# Patient Record
Sex: Male | Born: 1975 | Race: White | Hispanic: No | Marital: Single | Smoking: Former smoker
Health system: Southern US, Community
[De-identification: ages and names within clinical notes are randomized; demographics above are authoritative.]

## PROBLEM LIST (undated history)

## (undated) DIAGNOSIS — G4733 Obstructive sleep apnea (adult) (pediatric): Secondary | ICD-10-CM

## (undated) DIAGNOSIS — N200 Calculus of kidney: Secondary | ICD-10-CM

## (undated) DIAGNOSIS — Z9989 Dependence on other enabling machines and devices: Secondary | ICD-10-CM

## (undated) DIAGNOSIS — K648 Other hemorrhoids: Secondary | ICD-10-CM

## (undated) DIAGNOSIS — R7303 Prediabetes: Secondary | ICD-10-CM

## (undated) DIAGNOSIS — K5792 Diverticulitis of intestine, part unspecified, without perforation or abscess without bleeding: Secondary | ICD-10-CM

## (undated) DIAGNOSIS — Z87442 Personal history of urinary calculi: Secondary | ICD-10-CM

## (undated) DIAGNOSIS — M109 Gout, unspecified: Secondary | ICD-10-CM

## (undated) DIAGNOSIS — F419 Anxiety disorder, unspecified: Secondary | ICD-10-CM

## (undated) DIAGNOSIS — T7840XA Allergy, unspecified, initial encounter: Secondary | ICD-10-CM

## (undated) DIAGNOSIS — G4730 Sleep apnea, unspecified: Secondary | ICD-10-CM

## (undated) DIAGNOSIS — K5790 Diverticulosis of intestine, part unspecified, without perforation or abscess without bleeding: Secondary | ICD-10-CM

## (undated) HISTORY — DX: Sleep apnea, unspecified: G47.30

## (undated) HISTORY — DX: Obstructive sleep apnea (adult) (pediatric): G47.33

## (undated) HISTORY — DX: Diverticulosis of intestine, part unspecified, without perforation or abscess without bleeding: K57.90

## (undated) HISTORY — DX: Calculus of kidney: N20.0

## (undated) HISTORY — DX: Allergy, unspecified, initial encounter: T78.40XA

## (undated) HISTORY — DX: Gout, unspecified: M10.9

## (undated) HISTORY — DX: Dependence on other enabling machines and devices: Z99.89

## (undated) HISTORY — PX: NO PAST SURGERIES: SHX2092

## (undated) HISTORY — DX: Anxiety disorder, unspecified: F41.9

## (undated) HISTORY — DX: Other hemorrhoids: K64.8

---

## 1999-03-30 ENCOUNTER — Emergency Department (HOSPITAL_COMMUNITY): Admission: EM | Admit: 1999-03-30 | Discharge: 1999-03-30 | Payer: Self-pay | Admitting: Emergency Medicine

## 2007-11-07 ENCOUNTER — Encounter: Admission: RE | Admit: 2007-11-07 | Discharge: 2007-11-07 | Payer: Self-pay | Admitting: Internal Medicine

## 2007-11-12 ENCOUNTER — Encounter: Admission: RE | Admit: 2007-11-12 | Discharge: 2007-11-12 | Payer: Self-pay | Admitting: Internal Medicine

## 2007-11-14 ENCOUNTER — Ambulatory Visit (HOSPITAL_COMMUNITY): Admission: RE | Admit: 2007-11-14 | Discharge: 2007-11-14 | Payer: Self-pay | Admitting: Urology

## 2010-02-18 ENCOUNTER — Ambulatory Visit: Payer: Self-pay | Admitting: Internal Medicine

## 2010-02-18 DIAGNOSIS — G4733 Obstructive sleep apnea (adult) (pediatric): Secondary | ICD-10-CM | POA: Insufficient documentation

## 2010-02-22 ENCOUNTER — Telehealth: Payer: Self-pay | Admitting: Internal Medicine

## 2010-04-09 ENCOUNTER — Encounter: Payer: Self-pay | Admitting: Internal Medicine

## 2010-04-09 ENCOUNTER — Ambulatory Visit (HOSPITAL_BASED_OUTPATIENT_CLINIC_OR_DEPARTMENT_OTHER): Admission: RE | Admit: 2010-04-09 | Discharge: 2010-04-09 | Payer: Self-pay | Admitting: Internal Medicine

## 2010-04-11 ENCOUNTER — Ambulatory Visit: Payer: Self-pay | Admitting: Internal Medicine

## 2010-04-23 ENCOUNTER — Ambulatory Visit: Payer: Self-pay | Admitting: Internal Medicine

## 2010-04-27 ENCOUNTER — Encounter: Payer: Self-pay | Admitting: Internal Medicine

## 2010-05-11 ENCOUNTER — Encounter: Payer: Self-pay | Admitting: Internal Medicine

## 2010-06-01 ENCOUNTER — Encounter: Payer: Self-pay | Admitting: Internal Medicine

## 2010-06-02 ENCOUNTER — Ambulatory Visit: Payer: Self-pay | Admitting: Internal Medicine

## 2010-06-25 ENCOUNTER — Encounter: Payer: Self-pay | Admitting: Internal Medicine

## 2010-07-19 ENCOUNTER — Encounter: Payer: Self-pay | Admitting: Internal Medicine

## 2010-08-12 ENCOUNTER — Ambulatory Visit: Payer: Self-pay | Admitting: Internal Medicine

## 2010-11-11 NOTE — Assessment & Plan Note (Signed)
Summary: 2 months/apc   Primary Provider/Referring Provider:  Nila Nephew  CC:  2 month follow up  and cpap x 4 months averages 6 hrs per night.  History of Present Illness: History of Present Illness: Feb 18, 2010- 35 yo man who is aware of loud snoring, interupted unrestful sleep and questionig sleep apnea. Girlfriend says it sounds as if he has trouble breathing. This goes back at least 10 years. Says he is physically comfortable in bed. Bedtime 10-11 PM, sleep latency 20 minutes, waking 2-3 times before up at 7AM. Weight up 5 lbs recent years. No prior sleep evaluation. No ENT surgery. Mother snores.  April 23, 2010- OSA Returns for f/u of Sleep study. Severe OSA AHI 79.4/hr. Successful titration to 8cwp.  Discussed treatment options, sleep hygiene and medical issues.  June 02, 2010- OSA Current nasal mask caused bumps on nose he picked at. Now has red, broken skin. Sleeping better but still feels he wakes unrefreshed some days with sense he hasn't slep through the night. Not fighting  CPAP much. GF says he is not snoring through it. Currently at 8 cwp, [facility]. Has Download chip he hasn't yet turned in yet.  August 12, 2010- OSA Nurse-CC: 2 month follow up , cpap x 4 months averages 6 hrs per night Switched to nasal pilows mask because of skin irritation from nasal mask. CPAP is used every night. Feels he can't sleep soundly through night. Does fuss with mask some stll. Feels a little clearer, but still not well rested. He suspects he just isn't sleeping soundly. The CPAP titration download indicates great compliance with AHI 6 at CWP 8.    Preventive Screening-Counseling & Management  Alcohol-Tobacco     Smoking Status: quit     Packs/Day: 1.0     Year Started: age 46     Year Quit: 8 years ago  Current Medications (verified): 1)  Cpap .... Ahc Set On 8 Cwp  Allergies (verified): No Known Drug Allergies  Past History:  Past Medical History: Last updated:  06/02/2010 Obstructive sleep apnea  Kidney stones  Past Surgical History: Last updated: 02/18/2010 None  Family History: Last updated: 06/02/2010 Parents living None with sleep disorder  Social History: Last updated: 06/02/2010 Unmarried, no children Patient states former smoker. quit 2003 Employed [employer]  Risk Factors: Smoking Status: quit (08/12/2010) Packs/Day: 1.0 (08/12/2010)  Review of Systems      See HPI  The patient denies shortness of breath with activity, shortness of breath at rest, productive cough, non-productive cough, coughing up blood, chest pain, irregular heartbeats, acid heartburn, indigestion, loss of appetite, weight change, abdominal pain, difficulty swallowing, sore throat, tooth/dental problems, headaches, nasal congestion/difficulty breathing through nose, and sneezing.    Vital Signs:  Patient profile:   35 year old male Height:      71 inches Weight:      217.8 pounds O2 Sat:      97 % on Room air Pulse rate:   73 / minute BP sitting:   104 / 60  (left arm)  Vitals Entered By: Renold Genta RCP, LPN (August 12, 2010 3:40 PM)  O2 Flow:  Room air CC: 2 month follow up , cpap x 4 months averages 6 hrs per night Comments Medications reviewed with patient Renold Genta RCP, LPN  August 12, 2010 3:40 PM    Physical Exam  Additional Exam:  General: A/Ox3; pleasant and cooperative, NAD,  SKIN: nirritated skin at bridge of nose.  NODES: no lymphadenopathy HEENT: [place]/AT, EOM- WNL, Conjuctivae- clear, PERRLA, TM-WNL, Nose- clear, Throat- clear and wnl, tongue stud, Mallampati  IV, small mandible NECK: Supple w/ fair ROM, JVD- none, normal carotid impulses w/o bruits Thyroid- normal to palpation CHEST: Clear to P&A HEART: RRR, no m/g/r heard ABDOMEN: Soft and nl; GEX:BMWU, nl pulses, no edema  NEURO: Grossly intact to observation      Impression & Recommendations:  Problem # 1:  OBSTRUCTIVE SLEEP APNEA  (ICD-327.23)  Residual tiredness. We will try increasing pressure to 9 cwp and letting him use a sleepng pill some as needed.  We discussed CPAP goals, sleep hygiene and alternatives.   Medications Added to Medication List This Visit: 1)  Cpap  .... Ahc set on 9 cwp 2)  Temazepam 15 Mg Caps (Temazepam) .Marland Kitchen.. 1-2 for sleep if needed  Other Orders: Est. Patient Level III (13244) DME Referral (DME)  Patient Instructions: 1)  Please schedule a follow-up appointment in 3 months. Call sooner as needed 2)  We will contact [facility] to set CPAP at 9 3)  script for temazepam Prescriptions: TEMAZEPAM 15 MG CAPS (TEMAZEPAM) 1-2 for sleep if needed  #30 x 5   Entered and Authorized by:   Waymon Budge MD   Signed by:   Waymon Budge MD on 08/12/2010   Method used:   Print then Give to Patient   RxID:   [id number]

## 2010-11-11 NOTE — Miscellaneous (Signed)
Summary: CPAP set up info/[facility]  CPAP set up info/[facility]   Imported By: Sherian Rein 05/05/2010 09:13:47  _____________________________________________________________________  External Attachment:    Type:   Image     Comment:   External Document

## 2010-11-11 NOTE — Letter (Signed)
Summary: Statement of Medical Necessity/ [facility]  Statement of Medical Necessity/ [facility]   Imported By: Lennie Odor 05/17/2010 15:36:47  _____________________________________________________________________  External Attachment:    Type:   Image     Comment:   External Document

## 2010-11-11 NOTE — Assessment & Plan Note (Signed)
Summary: rov after sleep study/apc   Primary Provider/Referring Provider:  Nila Nephew  CC:  pt has no concerns or compliants and he is here for his sleep study results.  History of Present Illness: History of Present Illness: Feb 18, 2010- 35 yo man who is aware of loud snoring, interupted unrestful sleep and questionig sleep apnea. Girlfriend says it sounds as if he has trouble breathing. This goes back at least 10 years. Says he is physically comfortable in bed. Bedtime 10-11 PM, sleep latency 20 minutes, waking 2-3 times before up at 7AM. Weight up 5 lbs recent years. No prior sleep evaluation. No ENT surgery. Mother snores.  April 23, 2010- OSA Returns for f/u of Sleep study. Severe OSA AHI 79.4/hr. Successful titration to 8cwp.  Discussed treatment options, sleep hygiene and medical issues.    Preventive Screening-Counseling & Management  Alcohol-Tobacco     Smoking Status: quit     Packs/Day: 1.0     Year Started: age 35     Year Quit: 8 years ago  Allergies (verified): No Known Drug Allergies  Past History:  Past Medical History: Last updated: 02/18/2010 ? Obstructive sleep apnea  Kidney stones  Past Surgical History: Last updated: 02/18/2010 None  Family History: Last updated: 02/18/2010 Parents living  Social History: Last updated: 02/18/2010 Unmarried, no children' Patient states former smoker. quit 2003 Employed [employer]  Risk Factors: Smoking Status: quit (04/23/2010) Packs/Day: 1.0 (04/23/2010)  Social History: Packs/Day:  1.0  Review of Systems      See HPI  The patient denies anorexia, fever, weight loss, weight gain, vision loss, decreased hearing, hoarseness, chest pain, syncope, dyspnea on exertion, peripheral edema, prolonged cough, headaches, hemoptysis, abdominal pain, melena, hematochezia, severe indigestion/heartburn, muscle weakness, difficulty walking, and unusual weight change.    Vital Signs:  Patient profile:   35  year old male Height:      71 inches Weight:      205 pounds BMI:     28.70 O2 Sat:      98 % on Room air Pulse rate:   79 / minute BP sitting:   112 / 74  (right arm) Cuff size:   regular  Vitals Entered By: Carver Fila (April 23, 2010 9:07 AM)  O2 Flow:  Room air CC: pt has no concerns or compliants, he is here for his sleep study results Comments meds and allergies reviewed Phone number updated  Carver Fila  April 23, 2010 9:07 AM    Physical Exam  Additional Exam:  General: A/Ox3; pleasant and cooperative, NAD,  SKIN: no rash, lesions NODES: no lymphadenopathy HEENT: [place]/AT, EOM- WNL, Conjuctivae- clear, PERRLA, TM-WNL, Nose- clear, Throat- clear and wnl, tongue stud, Mallampati  IV, small mandible NECK: Supple w/ fair ROM, JVD- none, normal carotid impulses w/o bruits Thyroid- normal to palpation CHEST: Clear to P&A HEART: RRR, no m/g/r heard ABDOMEN: Soft and nl; EAV:WUJW, nl pulses, no edema  NEURO: Grossly intact to observation      Impression & Recommendations:  Problem # 1:  OBSTRUCTIVE SLEEP APNEA (ICD-327.23)  Severe sleep apnea with very small mandible. He is alert now and intelliigent. We discussed options in detail. We are going to start him with CPAP at 8 with subsequent adjustment. I have talked with him about working with the DME company. His mandible is going to make mask fitting more difficult.  Other Orders: Est. Patient Level IV (11914) DME Referral (DME)  Patient Instructions: 1)  Please schedule a follow-up appointment  in 1 month. 2)  See [facility] to set up CPAP

## 2010-11-11 NOTE — Assessment & Plan Note (Signed)
Summary: sleep apnea/ mb   Primary Provider/Referring Provider:  Nila Nephew  CC:  Sleep Consult-Dr. Chilton Si.  History of Present Illness: Feb 18, 2010- 35 yo man who is aware of loud snoring, interupted unrestful sleep and questionig sleep apnea. Girlfriend says it sounds as if he has trouble breathing. This goes back at least 10 years. Says he is physically comfortable in bed. Bedtime 10-11 PM, sleep latency 20 minutes, waking 2-3 times before up at 7AM. Weight up 5 lbs recent years. No prior sleep evaluation. No ENT surgery. Mother snores.  Preventive Screening-Counseling & Management  Alcohol-Tobacco     Smoking Status: quit  Current Medications (verified): 1)  None  Allergies (verified): No Known Drug Allergies  Past History:  Family History: Last updated: 02/18/2010 Parents living  Social History: Last updated: 02/18/2010 Unmarried, no children' Patient states former smoker. quit 2003 Employed [employer]  Risk Factors: Smoking Status: quit (02/18/2010)  Past Medical History: ? Obstructive sleep apnea  Kidney stones  Past Surgical History: None  Family History: Parents living  Social History: Unmarried, no children' Patient states former smoker. quit 2003 Employed [employer] Smoking Status:  quit  Review of Systems  The patient denies shortness of breath with activity, shortness of breath at rest, productive cough, non-productive cough, coughing up blood, chest pain, irregular heartbeats, acid heartburn, indigestion, loss of appetite, weight change, abdominal pain, difficulty swallowing, sore throat, tooth/dental problems, headaches, nasal congestion/difficulty breathing through nose, sneezing, itching, ear ache, anxiety, depression, hand/feet swelling, joint stiffness or pain, rash, change in color of mucus, and fever.    Vital Signs:  Patient profile:   35 year old male Height:      71 inches Weight:      201 pounds BMI:      28.14 O2 Sat:      98 % on Room air Pulse rate:   77 / minute BP sitting:   122 / 68  (right arm) Cuff size:   regular  Vitals Entered By: Reynaldo Minium CMA (Feb 18, 2010 9:27 AM)  O2 Flow:  Room air  Physical Exam  Additional Exam:  General: A/Ox3; pleasant and cooperative, NAD,  SKIN: no rash, lesions NODES: no lymphadenopathy HEENT: [place]/AT, EOM- WNL, Conjuctivae- clear, PERRLA, TM-WNL, Nose- clear, Throat- clear and wnl, tongue stud, Mallampati  IV, small mandible NECK: Supple w/ fair ROM, JVD- none, normal carotid impulses w/o bruits Thyroid- normal to palpation CHEST: Clear to P&A HEART: RRR, no m/g/r heard ABDOMEN: Soft and nl; ZHY:QMVH, nl pulses, no edema  NEURO: Grossly intact to observation      Impression & Recommendations:  Problem # 1:  OBSTRUCTIVE SLEEP APNEA (ICD-327.23)  Probable obstructive sleep apnea. He had looked it up on line and we discussed the diagnosis, medical concerns and some initial therapy considerations. Discussed weight and driving. We will schedue NPSG.  Other Orders: Consultation Level IV (84696) Sleep Disorder Referral (Sleep Disorder)  Patient Instructions: 1)  Please schedule a follow-up appointment in 1 month. 2)  See [facility] to schedule sleep study  Prevention & Chronic Care Immunizations   Influenza vaccine: Not documented    Tetanus booster: Not documented    Pneumococcal vaccine: Not documented  Other Screening   Smoking status: quit  (02/18/2010)

## 2010-11-11 NOTE — Progress Notes (Signed)
Summary: dictation question  Phone Note From Other Clinic   Caller: dr Tori Milks [phone] betty Call For: Ritvik Mczeal Summary of Call: needs smoking date on dictation changed pt quit in 2003 Initial call taken by: Lacinda Axon,  Feb 22, 2010 9:22 AM  Follow-up for Phone Call        ATC x2 line busy, wcb. Carron Curie CMA  Feb 22, 2010 9:35 AM  Spoke with Dr. Paralee Cancel office and they wanted to clarify because they have pt quit date as 2003, but on OV note it has 02/18/10 in parenthesis. I advised the 02/18/10 date was the last updated date, because that was when the info was entered, but under pt social history we have quit date as 2003. Carron Curie CMA  Feb 22, 2010 10:57 AM

## 2010-11-11 NOTE — Assessment & Plan Note (Signed)
Summary: 5 weeks/apc   Primary Chord Takahashi/Referring Carneshia Raker:  Nila Nephew  CC:  5 week follow up visit-Pt has not given [facility] his chip to have download.Marland Kitchen  History of Present Illness: CC:  pt has no concerns or compliants and he is here for his sleep study results. History of Present Illness: Feb 18, 2010- 35 yo man who is aware of loud snoring, interupted unrestful sleep and questionig sleep apnea. Girlfriend says it sounds as if he has trouble breathing. This goes back at least 10 years. Says he is physically comfortable in bed. Bedtime 10-11 PM, sleep latency 20 minutes, waking 2-3 times before up at 7AM. Weight up 5 lbs recent years. No prior sleep evaluation. No ENT surgery. Mother snores.  April 23, 2010- OSA Returns for f/u of Sleep study. Severe OSA AHI 79.4/hr. Successful titration to 8cwp.  Discussed treatment options, sleep hygiene and medical issues.  June 02, 2010- OSA Current nasal mask caused bumps on nose he picked at. Now has red, broken skin. Sleeping better but still feels he wakes unrefreshed some days with sense he hasn't slep through the night. Not fighting  CPAP much. GF says he is not snoring through it. Currently at 8 cwp, [facility]. Has Download chip he hasn't yet turned in yet.   Preventive Screening-Counseling & Management  Alcohol-Tobacco     Smoking Status: quit     Packs/Day: 1.0     Year Started: age 77     Year Quit: 8 years ago  Current Medications (verified): 1)  Cpap .... Ahc Set On 8 Cwp  Allergies (verified): No Known Drug Allergies  Past History:  Past Medical History: Obstructive sleep apnea  Kidney stones  Family History: Parents living None with sleep disorder  Social History: Unmarried, no children Patient states former smoker. quit 2003 Employed [employer]  Review of Systems      See HPI  The patient denies anorexia, fever, weight loss, weight gain, vision loss, decreased hearing, hoarseness, chest pain, syncope,  dyspnea on exertion, peripheral edema, prolonged cough, headaches, hemoptysis, abdominal pain, and melena.    Vital Signs:  Patient profile:   35 year old male Height:      71 inches Weight:      213.50 pounds BMI:     29.88 O2 Sat:      97 % on Room air Pulse rate:   68 / minute BP sitting:   114 / 76  (right arm) Cuff size:   regular  Vitals Entered By: Reynaldo Minium CMA (June 02, 2010 9:28 AM)  O2 Flow:  Room air CC: 5 week follow up visit-Pt has not given [facility] his chip to have download.   Physical Exam  Additional Exam:  General: A/Ox3; pleasant and cooperative, NAD,  SKIN: nirritated skin at bridge of nose. NODES: no lymphadenopathy HEENT: [place]/AT, EOM- WNL, Conjuctivae- clear, PERRLA, TM-WNL, Nose- clear, Throat- clear and wnl, tongue stud, Mallampati  IV, small mandible NECK: Supple w/ fair ROM, JVD- none, normal carotid impulses w/o bruits Thyroid- normal to palpation CHEST: Clear to P&A HEART: RRR, no m/g/r heard ABDOMEN: Soft and nl; ZOX:WRUE, nl pulses, no edema  NEURO: Grossly intact to observation      Impression & Recommendations:  Problem # 1:  OBSTRUCTIVE SLEEP APNEA (ICD-327.23)  He will need to heal up the skin on his nose and work on mask fit. i will have him use neosporin for a day or two, He will turn in his download chip. We are  not anticipating ENT or Oral/ maxillofacial surgery, so CPAP is the best option to manage his OSA.  Medications Added to Medication List This Visit: 1)  Cpap  .... Ah[facility] set on 8 cwp  Other Orders: Est. Patient Level III (52841)  Patient Instructions: 1)  Please schedule a follow-up appointment in 2 months. 2)  Suggest you leave the cpap mask off for 2 days and use a little neosporin so your nose will heal up. then resume CPAP.  3)  Meanwhile call [facility] and ask them about turning in the download chip. They will eventually send me a report that I will use in deciding what to do with the pressure settings.  4)  It might  help when you resume using the machine, to put a little padding- part of a cotton ball, under the nose piece to relive pressure.

## 2010-11-11 NOTE — Assessment & Plan Note (Signed)
Summary: 5 weeks/apc   Allergies: No Known Drug Allergies   Other Orders: No Charge Patient Arrived (NCPA0) (NCPA0)

## 2010-11-18 ENCOUNTER — Encounter: Payer: Self-pay | Admitting: Internal Medicine

## 2010-11-18 ENCOUNTER — Ambulatory Visit (INDEPENDENT_AMBULATORY_CARE_PROVIDER_SITE_OTHER): Payer: BC Managed Care – PPO | Admitting: Internal Medicine

## 2010-11-18 DIAGNOSIS — G4733 Obstructive sleep apnea (adult) (pediatric): Secondary | ICD-10-CM

## 2010-11-25 NOTE — Assessment & Plan Note (Signed)
Summary: 3 month return//mhh   Primary Provider/Referring Provider:  Nila Nephew  CC:  3 month follow up visit-sleep; Using CPAP machine each night..  History of Present Illness: June 02, 2010- OSA Current nasal mask caused bumps on nose he picked at. Now has red, broken skin. Sleeping better but still feels he wakes unrefreshed some days with sense he hasn't slep through the night. Not fighting  CPAP much. GF says he is not snoring through it. Currently at 8 cwp, [facility]. Has Download chip he hasn't yet turned in yet.  August 12, 2010- OSA Nurse-CC: 2 month follow up , cpap x 4 months averages 6 hrs per night Switched to nasal pilows mask because of skin irritation from nasal mask. CPAP is used every night. Feels he can't sleep soundly through night. Does fuss with mask some stll. Feels a little clearer, but still not well rested. He suspects he just isn't sleeping soundly. The CPAP titration download indicates great compliance with AHI 6 at CWP 8.  November 18, 2010- OSA Nurse-CC: 3 month follow up visit-sleep; Using CPAP machine each night. OSA/CPAP- [facility], 9 cwp, moved up from 8 after last visit. Missed once staying at his mother's. Discussed his billing experience with CPAP. Looks as if he may have gained a little weight.     Preventive Screening-Counseling & Management  Alcohol-Tobacco     Smoking Status: quit     Packs/Day: 1.0     Year Started: age 66     Year Quit: 8 years ago  Current Medications (verified): 1)  Cpap .... Ahc Set On 9 Cwp 2)  Temazepam 15 Mg Caps (Temazepam) .Marland Kitchen.. 1-2 For Sleep If Needed  Allergies (verified): No Known Drug Allergies  Past History:  Past Medical History: Last updated: 06/02/2010 Obstructive sleep apnea  Kidney stones  Past Surgical History: Last updated: 02/18/2010 None  Family History: Last updated: 06/02/2010 Parents living None with sleep disorder  Social History: Last updated: 06/02/2010 Unmarried, no  children Patient states former smoker. quit 2003 Employed [employer]  Risk Factors: Smoking Status: quit (11/18/2010) Packs/Day: 1.0 (11/18/2010)  Review of Systems      See HPI  The patient denies anorexia, fever, weight loss, vision loss, decreased hearing, hoarseness, chest pain, syncope, dyspnea on exertion, peripheral edema, prolonged cough, headaches, hemoptysis, abdominal pain, severe indigestion/heartburn, muscle weakness, difficulty walking, depression, abnormal bleeding, enlarged lymph nodes, and angioedema.    Vital Signs:  Patient profile:   35 year old male Height:      71 inches Weight:      220.25 pounds BMI:     30.83 O2 Sat:      96 % on Room air Pulse rate:   77 / minute BP sitting:   126 / 74  (left arm) Cuff size:   regular  Vitals Entered By: Reynaldo Minium CMA (November 18, 2010 3:48 PM)  O2 Flow:  Room air CC: 3 month follow up visit-sleep; Using CPAP machine each night.   Physical Exam  Additional Exam:  General: A/Ox3; pleasant and cooperative, NAD, relaxed and conversational SKIN: nirritated skin at bridge of nose. NODES: no lymphadenopathy HEENT: /AT, EOM- WNL, Conjuctivae- clear, PERRLA, TM-WNL, Nose- clear, Throat- clear and wnl, tongue stud, tongue coated,  Mallampati  IV, small mandible NECK: Supple w/ fair ROM, JVD- none, normal carotid impulses w/o bruits Thyroid- normal to palpation CHEST: Clear to P&A HEART: RRR, no m/g/r heard ABDOMEN: Soft and nl; ZOX:WRUE, nl pulses, no edema  NEURO: Grossly intact to observation      Impression & Recommendations:  Problem # 1:  OBSTRUCTIVE SLEEP APNEA (ICD-327.23)  Good complliance and control. We can leave him at pressure of 9. He does need to speak to billing office for answers to his questions.   Other Orders: Est. Patient Level III (51884)   Patient Instructions: 1)  Please schedule a follow-up appointment in 6 months for CPAP follow-up. Please call sooner as needed. 2)  i  encourage you to ask questions of the home care company billing office as needed- also feel free to ask for our billing representative.  3)  We are leaving your pressure set at 9

## 2011-05-19 ENCOUNTER — Ambulatory Visit (INDEPENDENT_AMBULATORY_CARE_PROVIDER_SITE_OTHER): Payer: BC Managed Care – PPO | Admitting: Internal Medicine

## 2011-05-19 ENCOUNTER — Encounter: Payer: Self-pay | Admitting: Internal Medicine

## 2011-05-19 VITALS — BP 118/70 | HR 76 | Ht 71.0 in | Wt 213.8 lb

## 2011-05-19 DIAGNOSIS — G4733 Obstructive sleep apnea (adult) (pediatric): Secondary | ICD-10-CM

## 2011-05-19 MED ORDER — AMPHETAMINE-DEXTROAMPHET ER 20 MG PO CP24: 20.0000 mg | ORAL_CAPSULE | ORAL | Status: AC

## 2011-05-19 NOTE — Progress Notes (Signed)
Subjective:    Patient ID: Roger Jennings, male    DOB: 05-12-1976, 35 y.o.   MRN: [id number]  HPI 05/19/11-71 year old former smoker followed for OSA Last here  November 18, 2010- note reviewed Continues to use CPAP all night every night. Current mask is comfortable. Stays tired. CPAP stops snoring using nasal prongs. Using sleeping pill only occasionally and not clear if it helps the daytime tiredness. His girlfriend has restless legs, but he denies that bothers him. 7-8 hours of sleep. Minimizes caffeine.   Review of Systems Constitutional:   No-   weight loss, night sweats, fevers, chills,                   + fatigue, lassitude. HEENT:   No-  headaches, difficulty swallowing, tooth/dental problems, sore throat,       No-  sneezing, itching, ear ache, nasal congestion, post nasal drip,  CV:  No-   chest pain, orthopnea, PND, swelling in lower extremities, anasarca, dizziness, palpitations Resp: No-   shortness of breath with exertion or at rest.              No-   productive cough,  No non-productive cough,  No-  coughing up of blood.              No-   change in color of mucus.  No- wheezing.   Skin: No-   rash or lesions. GI:  No-   heartburn, indigestion, abdominal pain, nausea, vomiting, diarrhea,                 change in bowel habits, loss of appetite GU: No-   dysuria, change in color of urine, no urgency or frequency.  No- flank pain. MS:  No-   joint pain or swelling.  No- decreased range of motion.  No- back pain. Neuro- grossly normal to observation, Or:  Psych:  No- change in mood or affect. No depression or anxiety.  No memory loss.      Objective:   Physical Exam General- Alert, Oriented, Affect-appropriate, Distress- none acute Skin- rash-none, lesions- none, excoriation- none Lymphadenopathy- none Head- atraumatic            Eyes- Gross vision intact, PERRLA, conjunctivae clear secretions            Ears- Hearing, canals normal            Nose- Clear, no -Septal  dev, mucus, polyps, erosion, perforation             Throat- Mallampati II-III , mucosa clear , drainage- none, tonsils- atrophic             tongue stud     Small mandible Neck- flexible , trachea midline, no stridor , thyroid nl, carotid no bruit Chest - symmetrical excursion , unlabored           Heart/CV- RRR , no murmur , no gallop  , no rub, nl s1 s2                           - JVD- none , edema- none, stasis changes- none, varices- none           Lung- clear to P&A, wheeze- none, cough- none , dullness-none, rub- none           Chest wall-  Abd- tender-no, distended-no, bowel sounds-present, HSM- no Br/ Gen/ Rectal- Not done, not indicated Extrem- cyanosis- none,  clubbing, none, atrophy- none, strength- nl Neuro- grossly intact to observation         Assessment & Plan:

## 2011-05-19 NOTE — Patient Instructions (Signed)
Try adderall 20 mg XR- script printed   Please call as needed

## 2011-05-19 NOTE — Assessment & Plan Note (Signed)
Control seems good and he is concerned about bills for previous tudies, not interested in having more testing. We discussed thrytoid and we very carefully discussed sleep hygiene and available stimulants. Plan- TSH, trial of generic adderall XR

## 2011-05-22 ENCOUNTER — Encounter: Payer: Self-pay | Admitting: Internal Medicine

## 2013-12-06 ENCOUNTER — Other Ambulatory Visit: Payer: Self-pay | Admitting: Internal Medicine

## 2013-12-06 ENCOUNTER — Ambulatory Visit
Admission: RE | Admit: 2013-12-06 | Discharge: 2013-12-06 | Disposition: A | Discharge status: Home / Self Care | Payer: BC Managed Care – PPO | Source: Ambulatory Visit | Attending: Internal Medicine | Admitting: Internal Medicine

## 2013-12-06 DIAGNOSIS — M79672 Pain in left foot: Secondary | ICD-10-CM

## 2014-04-16 ENCOUNTER — Ambulatory Visit
Admission: RE | Admit: 2014-04-16 | Discharge: 2014-04-16 | Disposition: A | Discharge status: Home / Self Care | Payer: BC Managed Care – PPO | Source: Ambulatory Visit | Attending: Internal Medicine | Admitting: Internal Medicine

## 2014-04-16 ENCOUNTER — Other Ambulatory Visit: Payer: Self-pay | Admitting: Internal Medicine

## 2014-04-16 ENCOUNTER — Encounter (INDEPENDENT_AMBULATORY_CARE_PROVIDER_SITE_OTHER): Payer: Self-pay

## 2014-04-16 DIAGNOSIS — R109 Unspecified abdominal pain: Secondary | ICD-10-CM

## 2014-04-16 DIAGNOSIS — R509 Fever, unspecified: Secondary | ICD-10-CM

## 2014-09-03 ENCOUNTER — Encounter (HOSPITAL_COMMUNITY): Payer: Self-pay | Admitting: *Deleted

## 2014-09-03 ENCOUNTER — Emergency Department (HOSPITAL_COMMUNITY)
Admission: EM | Admit: 2014-09-03 | Discharge: 2014-09-03 | Disposition: A | Discharge status: Home / Self Care | Payer: BC Managed Care – PPO | Attending: Emergency Medicine | Admitting: Emergency Medicine

## 2014-09-03 ENCOUNTER — Emergency Department (HOSPITAL_COMMUNITY): Payer: BC Managed Care – PPO

## 2014-09-03 DIAGNOSIS — Z9981 Dependence on supplemental oxygen: Secondary | ICD-10-CM | POA: Insufficient documentation

## 2014-09-03 DIAGNOSIS — K5732 Diverticulitis of large intestine without perforation or abscess without bleeding: Secondary | ICD-10-CM | POA: Diagnosis not present

## 2014-09-03 DIAGNOSIS — R509 Fever, unspecified: Secondary | ICD-10-CM | POA: Diagnosis not present

## 2014-09-03 DIAGNOSIS — Z87891 Personal history of nicotine dependence: Secondary | ICD-10-CM | POA: Diagnosis not present

## 2014-09-03 DIAGNOSIS — Z87442 Personal history of urinary calculi: Secondary | ICD-10-CM | POA: Insufficient documentation

## 2014-09-03 DIAGNOSIS — R000 Tachycardia, unspecified: Secondary | ICD-10-CM | POA: Insufficient documentation

## 2014-09-03 DIAGNOSIS — R1032 Left lower quadrant pain: Secondary | ICD-10-CM | POA: Diagnosis present

## 2014-09-03 DIAGNOSIS — G4733 Obstructive sleep apnea (adult) (pediatric): Secondary | ICD-10-CM | POA: Insufficient documentation

## 2014-09-03 DIAGNOSIS — R109 Unspecified abdominal pain: Secondary | ICD-10-CM

## 2014-09-03 HISTORY — DX: Diverticulitis of intestine, part unspecified, without perforation or abscess without bleeding: K57.92

## 2014-09-03 LAB — CBC WITH DIFFERENTIAL/PLATELET
Basophils Absolute: 0 10*3/uL (ref 0.0–0.1)
Basophils Relative: 0 % (ref 0–1)
Eosinophils Absolute: 0.1 10*3/uL (ref 0.0–0.7)
Eosinophils Relative: 1 % (ref 0–5)
HEMATOCRIT: 45.9 % (ref 39.0–52.0)
HEMOGLOBIN: 15.6 g/dL (ref 13.0–17.0)
LYMPHS ABS: 1.8 10*3/uL (ref 0.7–4.0)
LYMPHS PCT: 12 % (ref 12–46)
MCH: 30.2 pg (ref 26.0–34.0)
MCHC: 34 g/dL (ref 30.0–36.0)
MCV: 89 fL (ref 78.0–100.0)
MONO ABS: 1.2 10*3/uL — AB (ref 0.1–1.0)
MONOS PCT: 8 % (ref 3–12)
NEUTROS ABS: 11.6 10*3/uL — AB (ref 1.7–7.7)
NEUTROS PCT: 79 % — AB (ref 43–77)
Platelets: 135 10*3/uL — ABNORMAL LOW (ref 150–400)
RBC: 5.16 MIL/uL (ref 4.22–5.81)
RDW: 13.2 % (ref 11.5–15.5)
WBC: 14.7 10*3/uL — AB (ref 4.0–10.5)

## 2014-09-03 LAB — COMPREHENSIVE METABOLIC PANEL
ALBUMIN: 4.7 g/dL (ref 3.5–5.2)
ALK PHOS: 64 U/L (ref 39–117)
ALT: 39 U/L (ref 0–53)
ANION GAP: 17 — AB (ref 5–15)
AST: 26 U/L (ref 0–37)
BILIRUBIN TOTAL: 0.7 mg/dL (ref 0.3–1.2)
BUN: 18 mg/dL (ref 6–23)
CHLORIDE: 100 meq/L (ref 96–112)
CO2: 22 meq/L (ref 19–32)
CREATININE: 1.09 mg/dL (ref 0.50–1.35)
Calcium: 9.7 mg/dL (ref 8.4–10.5)
GFR calc Af Amer: >90 mL/min (ref >90)
GFR, EST NON AFRICAN AMERICAN: 85 mL/min — AB (ref >90)
Glucose, Bld: 95 mg/dL (ref 70–99)
POTASSIUM: 4 meq/L (ref 3.7–5.3)
Sodium: 139 mEq/L (ref 137–147)
Total Protein: 8 g/dL (ref 6.0–8.3)

## 2014-09-03 LAB — URINALYSIS, ROUTINE W REFLEX MICROSCOPIC
BILIRUBIN URINE: NEGATIVE
Glucose, UA: NEGATIVE mg/dL
KETONES UR: NEGATIVE mg/dL
Leukocytes, UA: NEGATIVE
NITRITE: NEGATIVE
PH: 5 (ref 5.0–8.0)
Protein, ur: NEGATIVE mg/dL
SPECIFIC GRAVITY, URINE: 1.023 (ref 1.005–1.030)
Urobilinogen, UA: 0.2 mg/dL (ref 0.0–1.0)

## 2014-09-03 LAB — URINE MICROSCOPIC-ADD ON

## 2014-09-03 LAB — I-STAT CG4 LACTIC ACID, ED: LACTIC ACID, VENOUS: 1.28 mmol/L (ref 0.5–2.2)

## 2014-09-03 LAB — LIPASE, BLOOD: LIPASE: 20 U/L (ref 11–59)

## 2014-09-03 MED ORDER — SODIUM CHLORIDE 0.9 % IV BOLUS (SEPSIS)
1000.0000 mL | Freq: Once | INTRAVENOUS | Status: AC
  Administered 2014-09-03: 1000 mL via INTRAVENOUS

## 2014-09-03 MED ORDER — CLINDAMYCIN PHOSPHATE 900 MG/50ML IV SOLN
900.0000 mg | Freq: Once | INTRAVENOUS | Status: AC
  Administered 2014-09-03: 900 mg via INTRAVENOUS
  Filled 2014-09-03: qty 50

## 2014-09-03 MED ORDER — OXYCODONE-ACETAMINOPHEN 5-325 MG PO TABS: 1.0000 | ORAL_TABLET | ORAL | Status: DC | PRN

## 2014-09-03 MED ORDER — METRONIDAZOLE 500 MG PO TABS: 500.0000 mg | ORAL_TABLET | Freq: Two times a day (BID) | ORAL | Status: DC

## 2014-09-03 MED ORDER — ACETAMINOPHEN 500 MG PO TABS: 500.0000 mg | ORAL_TABLET | Freq: Four times a day (QID) | ORAL | Status: DC | PRN

## 2014-09-03 MED ORDER — IBUPROFEN 800 MG PO TABS: 800.0000 mg | ORAL_TABLET | Freq: Three times a day (TID) | ORAL | Status: DC

## 2014-09-03 MED ORDER — ONDANSETRON HCL 4 MG/2ML IJ SOLN
4.0000 mg | INTRAMUSCULAR | Status: AC
  Administered 2014-09-03: 4 mg via INTRAVENOUS
  Filled 2014-09-03: qty 2

## 2014-09-03 MED ORDER — ONDANSETRON 4 MG PO TBDP: 4.0000 mg | ORAL_TABLET | Freq: Three times a day (TID) | ORAL | Status: DC | PRN

## 2014-09-03 MED ORDER — IOHEXOL 300 MG/ML  SOLN
100.0000 mL | Freq: Once | INTRAMUSCULAR | Status: AC | PRN
  Administered 2014-09-03: 100 mL via INTRAVENOUS

## 2014-09-03 MED ORDER — METRONIDAZOLE IN NACL 5-0.79 MG/ML-% IV SOLN
500.0000 mg | Freq: Once | INTRAVENOUS | Status: AC
  Administered 2014-09-03: 500 mg via INTRAVENOUS
  Filled 2014-09-03: qty 100

## 2014-09-03 MED ORDER — MORPHINE SULFATE 4 MG/ML IJ SOLN
4.0000 mg | Freq: Once | INTRAMUSCULAR | Status: AC
  Administered 2014-09-03: 4 mg via INTRAVENOUS
  Filled 2014-09-03: qty 1

## 2014-09-03 MED ORDER — ACETAMINOPHEN 500 MG PO TABS
1000.0000 mg | ORAL_TABLET | Freq: Once | ORAL | Status: AC
  Administered 2014-09-03: 1000 mg via ORAL
  Filled 2014-09-03: qty 2

## 2014-09-03 MED ORDER — IOHEXOL 300 MG/ML  SOLN
25.0000 mL | Freq: Once | INTRAMUSCULAR | Status: AC | PRN
  Administered 2014-09-03: 25 mL via ORAL

## 2014-09-03 MED ORDER — CLINDAMYCIN HCL 150 MG PO CAPS: 300.0000 mg | ORAL_CAPSULE | Freq: Three times a day (TID) | ORAL | Status: DC

## 2014-09-03 NOTE — ED Notes (Signed)
Called CT and told them pt finished contrast.

## 2014-09-03 NOTE — ED Provider Notes (Signed)
CSN: [id number]     Arrival date & time 09/03/14  1703 History   First MD Initiated Contact with Patient 09/03/14 1808     Chief Complaint  Patient presents with  . Abdominal Pain     (Consider location/radiation/quality/duration/timing/severity/associated sxs/prior Treatment) HPI Comments: Patient is a 38 yo M PMHx significant for diverticulitis presenting to the ED for LLQ abdominal pain for several days with associated blood in his stools with associated chills. Patient states he has had similar symptoms in the past approximately six months ago. He states he was seen by his PCP, had a CT abd/pelvis done diagnosed with diverticulitis and sent home with antibiotics on a clear liquids diet. No alleviating or aggravating factors. No abdominal surgical history.    Past Medical History  Diagnosis Date  . OSA on CPAP   . Kidney stones   . Sleep apnea   . Diverticulitis    History reviewed. No pertinent past surgical history. History reviewed. No pertinent family history. History  Substance Use Topics  . Smoking status: Former Smoker    Quit date: 10/10/2001  . Smokeless tobacco: Not on file  . Alcohol Use: No    Review of Systems  Constitutional: Positive for fever and chills.  Gastrointestinal: Positive for abdominal pain and blood in stool.  All other systems reviewed and are negative.     Allergies  Review of patient's allergies indicates no known allergies.  Home Medications   Prior to Admission medications   Medication Sig Start Date End Date Taking? Authorizing Provider  acetaminophen (TYLENOL) 500 MG tablet Take 1 tablet (500 mg total) by mouth every 6 (six) hours as needed. 09/03/14   Sharyon Peitz L Orville Widmann, PA-C  clindamycin (CLEOCIN) 150 MG capsule Take 2 capsules (300 mg total) by mouth 3 (three) times daily. May dispense as 150mg  capsules 09/03/14   Presly Steinruck L Jordis Repetto, PA-C  ibuprofen (ADVIL,MOTRIN) 800 MG tablet Take 1 tablet (800 mg total) by mouth 3  (three) times daily. 09/03/14   Deamonte Sayegh L Marceline Napierala, PA-C  metroNIDAZOLE (FLAGYL) 500 MG tablet Take 1 tablet (500 mg total) by mouth 2 (two) times daily. 09/03/14   Ourania Hamler L Gamaliel Charney, PA-C  ondansetron (ZOFRAN ODT) 4 MG disintegrating tablet Take 1 tablet (4 mg total) by mouth every 8 (eight) hours as needed for nausea or vomiting. 09/03/14   Kalese Ensz L Reynold Mantell, PA-C  oxyCODONE-acetaminophen (PERCOCET/ROXICET) 5-325 MG per tablet Take 1 tablet by mouth every 4 (four) hours as needed for severe pain. May take 2 tablets PO q 6 hours for severe pain - Do not take with Tylenol as this tablet already contains tylenol 09/03/14   Kennis Buell L Marquest Gunkel, PA-C   BP 104/58 mmHg  Pulse 108  Temp(Src) 99.5 F (37.5 C) (Oral)  Resp 22  Ht 5\' 10"  (1.778 m)  Wt 230 lb (104.327 kg)  BMI 33.00 kg/m2  SpO2 94% Physical Exam  Constitutional: He is oriented to person, place, and time. He appears well-developed and well-nourished. No distress.  HENT:  Head: Normocephalic and atraumatic.  Right Ear: External ear normal.  Left Ear: External ear normal.  Nose: Nose normal.  Mouth/Throat: Oropharynx is clear and moist. No oropharyngeal exudate.  Eyes: Conjunctivae are normal.  Neck: Neck supple.  Cardiovascular: Normal rate, regular rhythm and normal heart sounds.   Pulmonary/Chest: Effort normal and breath sounds normal. No respiratory distress.  Abdominal: Soft. Bowel sounds are normal. He exhibits no distension. There is tenderness in the right lower quadrant, suprapubic area and left  lower quadrant. There is no rigidity, no rebound and no guarding.  Neurological: He is alert and oriented to person, place, and time.  Skin: Skin is warm and dry. He is not diaphoretic.  Nursing note and vitals reviewed.   ED Course  Procedures (including critical care time) Medications  morphine 4 MG/ML injection 4 mg (4 mg Intravenous Given 09/03/14 1938)  ondansetron (ZOFRAN) injection 4 mg (4 mg  Intravenous Given 09/03/14 1938)  sodium chloride 0.9 % bolus 1,000 mL (0 mLs Intravenous Stopped 09/03/14 2041)  iohexol (OMNIPAQUE) 300 MG/ML solution 25 mL (25 mLs Oral Contrast Given 09/03/14 1908)  iohexol (OMNIPAQUE) 300 MG/ML solution 100 mL (100 mLs Intravenous Contrast Given 09/03/14 2000)  metroNIDAZOLE (FLAGYL) IVPB 500 mg (0 mg Intravenous Stopped 09/03/14 2215)  clindamycin (CLEOCIN) IVPB 900 mg (0 mg Intravenous Stopped 09/03/14 2249)  acetaminophen (TYLENOL) tablet 1,000 mg (1,000 mg Oral Given 09/03/14 2049)  sodium chloride 0.9 % bolus 1,000 mL (0 mLs Intravenous Stopped 09/03/14 2249)    Labs Review Labs Reviewed  CBC WITH DIFFERENTIAL - Abnormal; Notable for the following:    WBC 14.7 (*)    Platelets 135 (*)    Neutrophils Relative % 79 (*)    Neutro Abs 11.6 (*)    Monocytes Absolute 1.2 (*)    All other components within normal limits  COMPREHENSIVE METABOLIC PANEL - Abnormal; Notable for the following:    GFR calc non Af Amer 85 (*)    Anion gap 17 (*)    All other components within normal limits  URINALYSIS, ROUTINE W REFLEX MICROSCOPIC - Abnormal; Notable for the following:    Hgb urine dipstick TRACE (*)    All other components within normal limits  LIPASE, BLOOD  URINE MICROSCOPIC-ADD ON  I-STAT CG4 LACTIC ACID, ED    Imaging Review Ct Abdomen Pelvis W Contrast  09/03/2014   CLINICAL DATA:  Lower abdominal pain for several days. Blood in stool  EXAM: CT ABDOMEN AND PELVIS WITH CONTRAST  TECHNIQUE: Multidetector CT imaging of the abdomen and pelvis was performed using the standard protocol following bolus administration of intravenous contrast. Oral contrast was also administered.  CONTRAST:  173mL OMNIPAQUE IOHEXOL 300 MG/ML  SOLN  COMPARISON:  April 16, 2014  FINDINGS: There is mild scarring and atelectasis in both lung bases.  No focal liver lesions are identified. Gallbladder wall is not thickened. There is no biliary duct dilatation.  Spleen, pancreas,  and adrenals appear normal. Kidneys bilaterally show no mass or hydronephrosis. There is no renal or ureteral calculus on either side.  In the pelvis, the urinary bladder is midline with normal wall thickness. There is diverticulitis involving the mid to distal sigmoid colon. There is wall thickening and surrounding mesenteric stranding. No frank abscess. No evidence suggesting microperforation. There is no pelvic mass or fluid collection. Appendix appears normal. Terminal ileum appears normal. There is a small ventral hernia containing only.  There is no bowel obstruction. No free air or portal venous air. There is no ascites, adenopathy, or abscess in the abdomen or pelvis. There is no demonstrable abdominal aortic aneurysm. There is a small bone island in the left femoral head region. There is mild degenerative change at L5-S1. There are no lytic or destructive bone lesions.  IMPRESSION: Mid to distal sigmoid diverticulitis without frank abscess. No bowel obstruction. Appendix appears normal. No renal or ureteral calculus. No hydronephrosis.   Electronically Signed   By: Lowella Grip M.D.   On: 09/03/2014 20:20  EKG Interpretation None      MDM   Final diagnoses:  Abdominal pain  Diverticulitis of large intestine without perforation or abscess without bleeding    Filed Vitals:   09/03/14 2300  BP:   Pulse:   Temp: 99.5 F (37.5 C)  Resp:    Patient presented to the emergency department with low-grade fever, left lower quadrant abdominal pain with bloody stools consistent with previous diverticulitis exacerbations. He is febrile and tachycardic on initial examination. He has left lower quadrant abdominal tenderness without guarding, rigidity or rebound. I have reviewed nursing notes, vital signs, and all appropriate lab and imaging results for this patient. Tachycardia and fever improved with antipyretics, antibiotics and IV fluids. Abdominal examination remained benign. Patient is  able to tolerate by mouth intake. CT scan confirms diverticulitis without abscess or perforation. Will discharge patient home on antibiotics, pain management with dietary instructions with advised PCP follow-up. Patient is agreeable to plan and stable at time of discharge. Patient d/w with Dr. Wyvonnia Dusky, agrees with plan.       Harlow Mares, PA-C 09/03/14 2357  Ezequiel Essex, MD 09/04/14 [phone]

## 2014-09-03 NOTE — ED Notes (Signed)
Pt reports LLQ pain for several days, hx of diverticulitis. Pt reports having chills and blood in stools. No acute distress noted at triage.

## 2014-09-03 NOTE — Discharge Instructions (Signed)
Please follow up with your primary care physician in 1-2 days. If you do not have one please call the [facility] number listed above. Please take your antibiotic until completion. Please take pain medication and/or muscle relaxants as prescribed and as needed for pain. Please do not drive on narcotic pain medication or on muscle relaxants. Please follow DIET below. Please read all discharge instructions and return precautions.    Diverticulitis Diverticulitis is inflammation or infection of small pouches in your colon that form when you have a condition called diverticulosis. The pouches in your colon are called diverticula. Your colon, or large intestine, is where water is absorbed and stool is formed. Complications of diverticulitis can include:  Bleeding.  Severe infection.  Severe pain.  Perforation of your colon.  Obstruction of your colon. CAUSES  Diverticulitis is caused by bacteria. Diverticulitis happens when stool becomes trapped in diverticula. This allows bacteria to grow in the diverticula, which can lead to inflammation and infection. RISK FACTORS People with diverticulosis are at risk for diverticulitis. Eating a diet that does not include enough fiber from fruits and vegetables may make diverticulitis more likely to develop. SYMPTOMS  Symptoms of diverticulitis may include:  Abdominal pain and tenderness. The pain is normally located on the left side of the abdomen, but may occur in other areas.  Fever and chills.  Bloating.  Cramping.  Nausea.  Vomiting.  Constipation.  Diarrhea.  Blood in your stool. DIAGNOSIS  Your health care provider will ask you about your medical history and do a physical exam. You may need to have tests done because many medical conditions can cause the same symptoms as diverticulitis. Tests may include:  Blood tests.  Urine tests.  Imaging tests of the abdomen, including X-rays and CT scans. When your  condition is under control, your health care provider may recommend that you have a colonoscopy. A colonoscopy can show how severe your diverticula are and whether something else is causing your symptoms. TREATMENT  Most cases of diverticulitis are mild and can be treated at home. Treatment may include:  Taking over-the-counter pain medicines.  Following a clear liquid diet.  Taking antibiotic medicines by mouth for 7-10 days. More severe cases may be treated at a hospital. Treatment may include:  Not eating or drinking.  Taking prescription pain medicine.  Receiving antibiotic medicines through an IV tube.  Receiving fluids and nutrition through an IV tube.  Surgery. HOME CARE INSTRUCTIONS   Follow your health care provider's instructions carefully.  Follow a full liquid diet or other diet as directed by your health care provider. After your symptoms improve, your health care provider may tell you to change your diet. He or she may recommend you eat a high-fiber diet. Fruits and vegetables are good sources of fiber. Fiber makes it easier to pass stool.  Take fiber supplements or probiotics as directed by your health care provider.  Only take medicines as directed by your health care provider.  Keep all your follow-up appointments. SEEK MEDICAL CARE IF:   Your pain does not improve.  You have a hard time eating food.  Your bowel movements do not return to normal. SEEK IMMEDIATE MEDICAL CARE IF:   Your pain becomes worse.  Your symptoms do not get better.  Your symptoms suddenly get worse.  You have a fever.  You have repeated vomiting.  You have bloody or black, tarry stools. MAKE SURE YOU:   Understand these instructions.  Will watch  your condition.  Will get help right away if you are not doing well or get worse. Document Released: 07/06/2005 Document Revised: 10/01/2013 Document Reviewed: 08/21/2013 [facility] Patient Information 2015 [place], [place]. This  information is not intended to replace advice given to you by your health care provider. Make sure you discuss any questions you have with your health care provider. Clear Liquid Diet A clear liquid diet is a short-term diet that is prescribed to provide the necessary fluid and basic energy you need when you can have nothing else. The clear liquid diet consists of liquids or solids that will become liquid at room temperature. You should be able to see through the liquid. There are many reasons that you may be restricted to clear liquids, such as:  When you have a sudden-onset (acute) condition that occurs before or after surgery.  To help your body slowly get adjusted to food again after a long period when you were unable to have food.  Replacement of fluids when you have a diarrheal disease.  When you are going to have certain exams, such as a colonoscopy, in which instruments are inserted inside your body to look at parts of your digestive system. WHAT CAN I HAVE? A clear liquid diet does not provide all the nutrients you need. It is important to choose a variety of the following items to get as many nutrients as possible:  Vegetable juices that do not have pulp.  Fruit juices and fruit drinks that do not have pulp.  Coffee (regular or decaffeinated), tea, or soda at the discretion of your health care provider.  Clear bouillon, broth, or strained broth-based soups.  High-protein and flavored gelatins.  Sugar or honey.  Ices or frozen ice pops that do not contain milk. If you are not sure whether you can have certain items, you should ask your health care provider. You may also ask your health care provider if there are any other clear liquid options. Document Released: 09/26/2005 Document Revised: 10/01/2013 Document Reviewed: 08/23/2013 [facility] Patient Information 2015 [place], [place]. This information is not intended to replace advice given to you by your health care provider. Make  sure you discuss any questions you have with your health care provider.

## 2014-11-24 ENCOUNTER — Encounter: Payer: Self-pay | Admitting: Gastroenterology

## 2014-12-02 ENCOUNTER — Ambulatory Visit (INDEPENDENT_AMBULATORY_CARE_PROVIDER_SITE_OTHER): Payer: BLUE CROSS/BLUE SHIELD | Admitting: Gastroenterology

## 2014-12-02 ENCOUNTER — Encounter: Payer: Self-pay | Admitting: Gastroenterology

## 2014-12-02 VITALS — BP 126/62 | HR 84 | Ht 69.25 in | Wt 234.4 lb

## 2014-12-02 DIAGNOSIS — K5720 Diverticulitis of large intestine with perforation and abscess without bleeding: Secondary | ICD-10-CM | POA: Insufficient documentation

## 2014-12-02 DIAGNOSIS — K625 Hemorrhage of anus and rectum: Secondary | ICD-10-CM | POA: Insufficient documentation

## 2014-12-02 DIAGNOSIS — K5732 Diverticulitis of large intestine without perforation or abscess without bleeding: Secondary | ICD-10-CM | POA: Insufficient documentation

## 2014-12-02 NOTE — Patient Instructions (Signed)
You have been scheduled for a colonoscopy. Please follow written instructions given to you at your visit today.  If you use inhalers (even only as needed), please bring them with you on the day of your procedure. CC:  Levin Erp MD

## 2014-12-02 NOTE — Progress Notes (Signed)
12/02/2014 Roger Jennings [id number] 01/25/1976   HISTORY OF PRESENT ILLNESS:  This is a pleasant 39 year old male who is new to our practice and referred by Dr. Nyoka Cowden for evaluation regarding episodes of diverticulitis.  He's had two episodes of diverticulitis in the sigmoid colon, two months apart, with one in July and one in November.  Both were documented on CT scan.  He knows that he was treated with cipro and flagyl in November (cannot remember what he was treated with in July), but was treated successfully on both occasions.  No issues since the last episode in November.  He has been trying to avoid nuts and seeds, which he has been doing since the first episode so he was puzzled as to why he had the second episode.  During the second episode he saw a small amount of bright red blood with BM's, which resolved quickly as well.  Has no other symptoms currently.     Past Medical History  Diagnosis Date  . OSA on CPAP   . Kidney stones   . Sleep apnea   . Diverticulitis    Past Surgical History  Procedure Laterality Date  . No past surgeries      reports that he quit smoking about 13 years ago. His smoking use included Cigarettes. He does not have any smokeless tobacco history on file. He reports that he does not drink alcohol or use illicit drugs. family history includes Coronary artery disease in his paternal grandmother; Diabetes Mellitus II in his mother; Hypertension in his father. There is no history of Colon cancer, Colon polyps, Kidney disease, Esophageal cancer, or Gallbladder disease. No Known Allergies    Outpatient Encounter Prescriptions as of 12/02/2014  Medication Sig  . [DISCONTINUED] acetaminophen (TYLENOL) 500 MG tablet Take 1 tablet (500 mg total) by mouth every 6 (six) hours as needed.  . [DISCONTINUED] clindamycin (CLEOCIN) 150 MG capsule Take 2 capsules (300 mg total) by mouth 3 (three) times daily. May dispense as 150mg  capsules  . [DISCONTINUED] ibuprofen  (ADVIL,MOTRIN) 800 MG tablet Take 1 tablet (800 mg total) by mouth 3 (three) times daily.  . [DISCONTINUED] metroNIDAZOLE (FLAGYL) 500 MG tablet Take 1 tablet (500 mg total) by mouth 2 (two) times daily.  . [DISCONTINUED] ondansetron (ZOFRAN ODT) 4 MG disintegrating tablet Take 1 tablet (4 mg total) by mouth every 8 (eight) hours as needed for nausea or vomiting.  . [DISCONTINUED] oxyCODONE-acetaminophen (PERCOCET/ROXICET) 5-325 MG per tablet Take 1 tablet by mouth every 4 (four) hours as needed for severe pain. May take 2 tablets PO q 6 hours for severe pain - Do not take with Tylenol as this tablet already contains tylenol     REVIEW OF SYSTEMS  : All other systems reviewed and negative except where noted in the History of Present Illness.   PHYSICAL EXAM: BP 126/62 mmHg  Pulse 84  Ht 5' 9.25" (1.759 m)  Wt 234 lb 6 oz (106.312 kg)  BMI 34.36 kg/m2 General: Well developed white male in no acute distress Head: Normocephalic and atraumatic Eyes:  Sclerae anicteric, conjunctiva pink. Ears: Normal auditory acuity Lungs: Clear throughout to auscultation Heart: Regular rate and rhythm Abdomen: Soft, non-distended.  Normal bowel sounds.  Non-tender. Rectal:  Will be done at the time of colonoscopy. Musculoskeletal: Symmetrical with no gross deformities  Skin: No lesions on visible extremities Extremities: No edema  Neurological: Alert oriented x 4, grossly non-focal Psychological:  Alert and cooperative. Normal mood and  affect  ASSESSMENT AND PLAN: -Diverticulitis:  2 episodes within 4 months.  Had some mild rectal bleeding with the second episode.  Symptoms resolved well and quickly with antibiotics.  I do not think that it is unreasonable to perform colonoscopy to rule out colitis, malignancy, etc.  Will schedule with Dr. Deatra Ina.  The risks, benefits, and alternatives were discussed with the patient and he consents to proceed.  Discussed high fiber diet and increasing water/liquid in his  diet as well.

## 2014-12-08 NOTE — Progress Notes (Signed)
Reviewed and agree with management. Robert D. Kaplan, M.D., FACG  

## 2014-12-25 ENCOUNTER — Encounter: Payer: Self-pay | Admitting: Gastroenterology

## 2015-01-08 ENCOUNTER — Ambulatory Visit (AMBULATORY_SURGERY_CENTER): Payer: BLUE CROSS/BLUE SHIELD | Admitting: Gastroenterology

## 2015-01-08 ENCOUNTER — Encounter: Payer: Self-pay | Admitting: Gastroenterology

## 2015-01-08 VITALS — BP 102/62 | HR 70 | Temp 96.7°F | Resp 20 | Ht 69.25 in | Wt 234.0 lb

## 2015-01-08 DIAGNOSIS — K5730 Diverticulosis of large intestine without perforation or abscess without bleeding: Secondary | ICD-10-CM | POA: Diagnosis not present

## 2015-01-08 DIAGNOSIS — K5732 Diverticulitis of large intestine without perforation or abscess without bleeding: Secondary | ICD-10-CM

## 2015-01-08 MED ORDER — SODIUM CHLORIDE 0.9 % IV SOLN: 500.0000 mL | INTRAVENOUS | Status: DC

## 2015-01-08 NOTE — Progress Notes (Signed)
Report to PACU, RN, vss, BBS= Clear.  

## 2015-01-08 NOTE — Op Note (Signed)
[facility]  [address] [place], [address]   COLONOSCOPY PROCEDURE REPORT  PATIENT: Roger Jennings, Roger Jennings  MR#: [id number] BIRTHDATE: 16-Jul-1976 , 56  yrs. old GENDER: male ENDOSCOPIST: Inda Castle, MD REFERRED YJ:EHUDJ Nyoka Cowden, M.D. PROCEDURE DATE:  01/08/2015 PROCEDURE:   Colonoscopy, diagnostic First Screening Colonoscopy - Avg.  risk and is 50 yrs.  old or older Yes.  Prior Negative Screening - Now for repeat screening. N/A  History of Adenoma - Now for follow-up colonoscopy & has been > or = to 3 yrs.  N/A ASA CLASS:   Class II INDICATIONS:Colorectal Neoplasm Risk Assessment for this procedure is average risk and history of recurrent diverticulitis. MEDICATIONS: Monitored anesthesia care and Propofol 300 mg IV  DESCRIPTION OF PROCEDURE:   After the risks benefits and alternatives of the procedure were thoroughly explained, informed consent was obtained.  The digital rectal exam revealed no abnormalities of the rectum.   The [id number]  endoscope was introduced through the anus and advanced to the ileum. No adverse events experienced.   The quality of the prep was (Suprep was used) excellent.  The instrument was then slowly withdrawn as the colon was fully examined.      COLON FINDINGS: There was mild diverticulosis noted in the descending colon and sigmoid colon with associated mild inflammatory changes.   Internal hemorrhoids were found. Retroflexed views revealed no abnormalities. The time to cecum = 2.3 Withdrawal time = 8.4   The scope was withdrawn and the procedure completed. COMPLICATIONS: There were no immediate complications.  ENDOSCOPIC IMPRESSION: 1.   There was mild diverticulosis noted in the descending colon and sigmoid colon with stigmata of recent diverticulitis 2.   Internal hemorrhoids  RECOMMENDATIONS: 1.  High fiber diet 2.  Colonoscopy 10 years  eSigned:  Inda Castle, MD 01/08/2015 2:41 PM   cc:

## 2015-01-08 NOTE — Patient Instructions (Signed)
YOU HAD AN ENDOSCOPIC PROCEDURE TODAY AT [facility]:   Refer to the procedure report that was given to you for any specific questions about what was found during the examination.  If the procedure report does not answer your questions, please call your gastroenterologist to clarify.  If you requested that your care partner not be given the details of your procedure findings, then the procedure report has been included in a sealed envelope for you to review at your convenience later.  YOU SHOULD EXPECT: Some feelings of bloating in the abdomen. Passage of more gas than usual.  Walking can help get rid of the air that was put into your GI tract during the procedure and reduce the bloating. If you had a lower endoscopy (such as a colonoscopy or flexible sigmoidoscopy) you may notice spotting of blood in your stool or on the toilet paper. If you underwent a bowel prep for your procedure, you may not have a normal bowel movement for a few days.  Please Note:  You might notice some irritation and congestion in your nose or some drainage.  This is from the oxygen used during your procedure.  There is no need for concern and it should clear up in a day or so.  SYMPTOMS TO REPORT IMMEDIATELY:   Following lower endoscopy (colonoscopy or flexible sigmoidoscopy):  Excessive amounts of blood in the stool  Significant tenderness or worsening of abdominal pains  Swelling of the abdomen that is new, acute  Fever of 100F or higher    For urgent or emergent issues, a gastroenterologist can be reached at any hour by calling [phone].   DIET: Your first meal following the procedure should be a small meal and then it is ok to progress to your normal diet. Heavy or fried foods are harder to digest and may make you feel nauseous or bloated.  Likewise, meals heavy in dairy and vegetables can increase bloating.  Drink plenty of fluids but you should avoid alcoholic beverages for 24  hours.  ACTIVITY:  You should plan to take it easy for the rest of today and you should NOT DRIVE or use heavy machinery until tomorrow (because of the sedation medicines used during the test).    FOLLOW UP: Our staff will call the number listed on your records the next business day following your procedure to check on you and address any questions or concerns that you may have regarding the information given to you following your procedure. If we do not reach you, we will leave a message.  However, if you are feeling well and you are not experiencing any problems, there is no need to return our call.  We will assume that you have returned to your regular daily activities without incident.  If any biopsies were taken you will be contacted by phone or by letter within the next 1-3 weeks.  Please call us at [phone] if you have not heard about the biopsies in 3 weeks.    SIGNATURES/CONFIDENTIALITY: You and/or your care partner have signed paperwork which will be entered into your electronic medical record.  These signatures attest to the fact that that the information above on your After Visit Summary has been reviewed and is understood.  Full responsibility of the confidentiality of this discharge information lies with you and/or your care-partner.    informatiin on diverticulosis,hemorrhoids and high fiber diet given to you today

## 2015-01-09 ENCOUNTER — Telehealth: Payer: Self-pay | Admitting: *Deleted

## 2015-01-09 NOTE — Telephone Encounter (Signed)
  Follow up Call-  Call back number 01/08/2015  Post procedure Call Back phone  # [phone] cell  Permission to leave phone message Yes     Patient questions:  Do you have a fever, pain , or abdominal swelling? No. Pain Score  0 *  Have you tolerated food without any problems? Yes.    Have you been able to return to your normal activities? Yes.    Do you have any questions about your discharge instructions: Diet   No. Medications  No. Follow up visit  No.  Do you have questions or concerns about your Care? No.  Actions: * If pain score is 4 or above: No action needed, pain <4.

## 2015-07-14 ENCOUNTER — Ambulatory Visit
Admission: RE | Admit: 2015-07-14 | Discharge: 2015-07-14 | Disposition: A | Discharge status: Home / Self Care | Payer: BLUE CROSS/BLUE SHIELD | Source: Ambulatory Visit | Attending: Internal Medicine | Admitting: Internal Medicine

## 2015-07-14 ENCOUNTER — Other Ambulatory Visit: Payer: Self-pay | Admitting: Internal Medicine

## 2015-07-14 DIAGNOSIS — R52 Pain, unspecified: Secondary | ICD-10-CM

## 2016-06-14 ENCOUNTER — Ambulatory Visit (INDEPENDENT_AMBULATORY_CARE_PROVIDER_SITE_OTHER): Payer: BLUE CROSS/BLUE SHIELD | Admitting: Podiatry

## 2016-06-14 ENCOUNTER — Ambulatory Visit (INDEPENDENT_AMBULATORY_CARE_PROVIDER_SITE_OTHER): Payer: BLUE CROSS/BLUE SHIELD

## 2016-06-14 ENCOUNTER — Encounter: Payer: Self-pay | Admitting: Podiatry

## 2016-06-14 DIAGNOSIS — M722 Plantar fascial fibromatosis: Secondary | ICD-10-CM

## 2016-06-14 MED ORDER — METHYLPREDNISOLONE 4 MG PO TBPK: ORAL_TABLET | ORAL | 0 refills | Status: DC

## 2016-06-14 MED ORDER — MELOXICAM 15 MG PO TABS: 15.0000 mg | ORAL_TABLET | Freq: Every day | ORAL | 3 refills | Status: DC

## 2016-06-14 NOTE — Progress Notes (Signed)
   Subjective:    Patient ID: Roger Jennings, male    DOB: 1976/05/08, 40 y.o.   MRN: [id number]  HPI: He presents today with chief complaint of plantar heel pain left. He states it is been aching for the past several months a lot in the morning but mostly after sitting and then getting back up stand. He states that he's been taking Lodine for gout and is being toe. He also takes colchicine on a daily basis for gout. States that he has had only one flare since starting the colchicine in the past year.    Review of Systems  Musculoskeletal: Positive for gait problem.  All other systems reviewed and are negative.      Objective:   Physical Exam: Vital signs are stable he is alert and oriented 3 pulses are strongly palpable. Neurologic sensorium is intact. Deep tendon reflexes are intact. Muscle strength +5 over 5 dorsiflexion plantar flexors and inverters everters all intrinsic musculature appears to be intact symmetrical bilateral. Orthopedic evaluation demonstrates pain on palpation medial calcaneal tubercle of the left foot area is good range of motion of the first metatarsophalangeal joints bilateral. Radius taken today demonstrate soft tissue increase in density at the plantar fascia calcaneal insertion site only no significant plantar spurring or osteoarthritic changes.        Assessment & Plan:  Plantar fasciitis left heel. History of gout.  Plan: Start him on a Medrol Dosepak to be followed by meloxicam. He will continue his colchicine regularly. Once he has a gout flare he will notify immediately. I did inject his left heel today with Kenalog and local anesthetic placement of plantar fascia brace to be followed by a night splint. We dispensed stretching exercises for the plantar fascia. We discussed appropriate shoe gear stretching exercises ice therapy and shoe gear modifications. I will follow-up with him in 1 month or sooner if needed.

## 2016-06-14 NOTE — Patient Instructions (Signed)

## 2016-07-14 ENCOUNTER — Ambulatory Visit (INDEPENDENT_AMBULATORY_CARE_PROVIDER_SITE_OTHER): Payer: BLUE CROSS/BLUE SHIELD | Admitting: Podiatry

## 2016-07-14 DIAGNOSIS — M722 Plantar fascial fibromatosis: Secondary | ICD-10-CM

## 2016-07-14 NOTE — Progress Notes (Signed)
He presents today for follow-up of plantar fasciitis in his left foot. He states that he is 95% improved. He does not utilize his night splint because it irritates him. And he does not wear his plantar fascia brace because it is painful after about a half a days work. He took the medication and continues to take meloxicam as directed.  Objective: Vital signs are stable alert and oriented 3 pulses are palpable. No Pain. He retains some tenderness on palpation medial calcaneal tubercle of the left heel but much more supple and no erythema or edema is present.  Assessment: Well-healing plantar fasciitis left foot.  Plan: We discussed etiology pathology conservative versus surgical therapies. I did suggest he continue all conservative therapies including the braces and the medication. I suggested that he continue this for 1 month. Should he regress he is to notify us immediately and orthotics will be prescribed.

## 2016-08-11 ENCOUNTER — Ambulatory Visit: Payer: BLUE CROSS/BLUE SHIELD | Admitting: Podiatry

## 2016-08-16 ENCOUNTER — Ambulatory Visit (INDEPENDENT_AMBULATORY_CARE_PROVIDER_SITE_OTHER): Payer: BLUE CROSS/BLUE SHIELD | Admitting: Podiatry

## 2016-08-16 ENCOUNTER — Encounter: Payer: Self-pay | Admitting: Podiatry

## 2016-08-16 DIAGNOSIS — M722 Plantar fascial fibromatosis: Secondary | ICD-10-CM | POA: Diagnosis not present

## 2016-08-16 NOTE — Progress Notes (Signed)
He presents today for follow-up of his plantar fasciitis of his left heel. He states that is not doing as well as it was last visit. He states that he has had some regression that he has continued all conservative therapies without variation.  Objective: Vital signs are stable he is alert and oriented 3 pulses are palpable. Neurologic sensorium is intact. He still has pain on palpation medially continue tubercle of the left heel.  Assessment: Chronic intractable plantar fasciitis left.  Plan: Reinjected his left heel today asked him to continue all conservative therapies and he was scanned for a set of orthotics.

## 2016-08-18 ENCOUNTER — Ambulatory Visit: Payer: BLUE CROSS/BLUE SHIELD | Admitting: Podiatry

## 2016-09-06 ENCOUNTER — Encounter: Payer: Self-pay | Admitting: Podiatry

## 2016-09-06 ENCOUNTER — Ambulatory Visit (INDEPENDENT_AMBULATORY_CARE_PROVIDER_SITE_OTHER): Payer: BLUE CROSS/BLUE SHIELD | Admitting: Podiatry

## 2016-09-06 DIAGNOSIS — M722 Plantar fascial fibromatosis: Secondary | ICD-10-CM

## 2016-09-06 NOTE — Patient Instructions (Signed)

## 2016-09-06 NOTE — Progress Notes (Signed)
Dispensed patient's orthotics with oral and written instructions for wearing. Patient will follow up with Dr. Hyatt in 1 month for an orthotic check. 

## 2016-09-29 ENCOUNTER — Other Ambulatory Visit: Payer: Self-pay | Admitting: Podiatry

## 2016-09-29 NOTE — Telephone Encounter (Signed)
Pt needs an appt prior to future refills. 

## 2016-11-03 ENCOUNTER — Telehealth: Payer: Self-pay | Admitting: *Deleted

## 2016-11-03 NOTE — Telephone Encounter (Signed)
Pt states he would like a cheaper medication than the colcrys, he can't afford to pay for insurance and the medication cost $400.00. I reviewed pt's clinicals and pt has only been treated for plantar fasciitis, and not gout, the colcrys was prescribed by another doctor. I informed pt that he would need to be seen either by Dr. Milinda Pointer or another podiatrist, or his PCP. I reviewed pt's health hx and he has diverticulitis and would best be followed by him for both the gout and diverticulitis. Pt states he doesn't have insurance and how much would it cost to be seen in our office. I told him the schedulers would be able to help him with that information.

## 2016-11-14 ENCOUNTER — Ambulatory Visit: Payer: Self-pay | Attending: Internal Medicine | Admitting: Podiatry

## 2016-11-14 DIAGNOSIS — M109 Gout, unspecified: Secondary | ICD-10-CM

## 2016-11-14 DIAGNOSIS — M722 Plantar fascial fibromatosis: Secondary | ICD-10-CM

## 2016-11-14 MED ORDER — MELOXICAM 15 MG PO TABS: 15.0000 mg | ORAL_TABLET | Freq: Every day | ORAL | 1 refills | Status: DC

## 2016-11-14 MED ORDER — ALLOPURINOL 100 MG PO TABS: 100.0000 mg | ORAL_TABLET | Freq: Every day | ORAL | 6 refills | Status: DC

## 2016-11-15 ENCOUNTER — Telehealth: Payer: Self-pay | Admitting: Podiatry

## 2016-11-15 MED ORDER — ALLOPURINOL 100 MG PO TABS: 100.0000 mg | ORAL_TABLET | Freq: Every day | ORAL | 6 refills | Status: DC

## 2016-11-15 NOTE — Progress Notes (Signed)
[place].   Subjective: Patient presents today requesting a refill of his colchicine and mobic. He states he has been taking colchicine every other day for gout to help prevent reoccurrence but it has become expensive. He currently denies any gout flare and no pain or swelling to his feet. He takes mobic prn for heel pain, which he currently does not have. He continues with orthotics and stretching for his heel. Denies any systemic complaints such as fevers, chills, nausea, vomiting. No acute changes since last appointment, and no other complaints at this time.   Objective: AAO x3, NAD DP/PT pulses palpable bilaterally, CRT less than 3 seconds No area of tenderness to bilateral lower extremities. No swelling, redness. No active gout flare. No pain along the plantar fascia.  No open lesions or pre-ulcerative lesions.  No pain with calf compression, swelling, warmth, erythema  Assessment: History of multiple gout flares and plantar fasciitis.   Plan: -All treatment options discussed with the patient including all alternatives, risks, complications.  -Will switch to allopurinol 100mg  daily for gout ppx. Colchicine prn for acute flares -Prescribed mobic. Discussed side effects of the medication and directed to stop if any are to occur and call the office.  -RTC prn  -Patient encouraged to call the office with any questions, concerns, change in symptoms.   Celesta Gentile, DPM

## 2016-11-15 NOTE — Telephone Encounter (Signed)
I resent allopurinol to his pharmacy. Not sure why it didn't go through yesterday it was sent. Please let him know. Thanks.

## 2016-11-15 NOTE — Telephone Encounter (Signed)
Pt called and said the pharmacy said they only got the one rx for the mobic.Can you resend the other one please

## 2017-02-22 ENCOUNTER — Other Ambulatory Visit: Payer: Self-pay | Admitting: Podiatry

## 2017-02-22 NOTE — Telephone Encounter (Signed)
Pt calling for meloxicam refill.[facility] since [facility] not [facility]. Can you please refill for him

## 2017-02-23 ENCOUNTER — Other Ambulatory Visit: Payer: Self-pay | Admitting: Podiatry

## 2017-02-23 MED ORDER — MELOXICAM 15 MG PO TABS: 15.0000 mg | ORAL_TABLET | Freq: Every day | ORAL | 1 refills | Status: DC

## 2017-02-23 NOTE — Telephone Encounter (Signed)
I have sent the prescription to his pharmacy 

## 2017-04-17 ENCOUNTER — Other Ambulatory Visit: Payer: Self-pay | Admitting: Podiatry

## 2017-05-07 ENCOUNTER — Encounter (HOSPITAL_COMMUNITY): Payer: Self-pay | Admitting: Emergency Medicine

## 2017-05-07 ENCOUNTER — Emergency Department (HOSPITAL_COMMUNITY)
Admission: EM | Admit: 2017-05-07 | Discharge: 2017-05-08 | Disposition: A | Discharge status: Home / Self Care | Payer: Self-pay | Attending: Emergency Medicine | Admitting: Emergency Medicine

## 2017-05-07 DIAGNOSIS — Y939 Activity, unspecified: Secondary | ICD-10-CM | POA: Insufficient documentation

## 2017-05-07 DIAGNOSIS — S62336A Displaced fracture of neck of fifth metacarpal bone, right hand, initial encounter for closed fracture: Secondary | ICD-10-CM | POA: Insufficient documentation

## 2017-05-07 DIAGNOSIS — Z87891 Personal history of nicotine dependence: Secondary | ICD-10-CM | POA: Insufficient documentation

## 2017-05-07 DIAGNOSIS — Y929 Unspecified place or not applicable: Secondary | ICD-10-CM | POA: Insufficient documentation

## 2017-05-07 DIAGNOSIS — Y999 Unspecified external cause status: Secondary | ICD-10-CM | POA: Insufficient documentation

## 2017-05-07 DIAGNOSIS — S0121XA Laceration without foreign body of nose, initial encounter: Secondary | ICD-10-CM | POA: Insufficient documentation
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
## 2017-05-08 ENCOUNTER — Emergency Department (HOSPITAL_COMMUNITY): Payer: Self-pay

## 2017-05-08 DIAGNOSIS — S62366A Nondisplaced fracture of neck of fifth metacarpal bone, right hand, initial encounter for closed fracture: Secondary | ICD-10-CM

## 2017-05-08 HISTORY — DX: Nondisplaced fracture of neck of fifth metacarpal bone, right hand, initial encounter for closed fracture: S62.366A

## 2017-05-08 MED ORDER — IBUPROFEN 800 MG PO TABS
800.0000 mg | ORAL_TABLET | Freq: Once | ORAL | Status: AC
  Administered 2017-05-08: 800 mg via ORAL
  Filled 2017-05-08: qty 1

## 2017-05-08 MED ORDER — HYDROCODONE-ACETAMINOPHEN 5-325 MG PO TABS: 1.0000 | ORAL_TABLET | Freq: Four times a day (QID) | ORAL | 0 refills | Status: DC | PRN

## 2017-05-08 MED ORDER — LIDOCAINE-EPINEPHRINE (PF) 2 %-1:200000 IJ SOLN
10.0000 mL | Freq: Once | INTRAMUSCULAR | Status: DC
  Filled 2017-05-08: qty 20

## 2017-05-08 NOTE — ED Provider Notes (Signed)
I was asked to close the small laceration on the bridge of the nose by Dr. Tomi Bamberger.  LACERATION REPAIR Performed by: Montine Circle Authorized by: Montine Circle Consent: Verbal consent obtained. Risks and benefits: risks, benefits and alternatives were discussed Consent given by: patient Patient identity confirmed: provided demographic data Prepped and Draped in normal sterile fashion Wound explored  Laceration Location: Bridge of nose  Laceration Length: 0.5 cm  No Foreign Bodies seen or palpated  Anesthesia: none  Local anesthetic: none  Anesthetic total: none  Irrigation method: syringe Amount of cleaning: standard  Skin closure: dermabond  Number of sutures: dermabond  Technique: dermabond  Patient tolerance: Patient tolerated the procedure well with no immediate complications.    Montine Circle, PA-C 05/08/17 0240    Rolland Porter, MD 05/08/17 [phone]

## 2017-05-08 NOTE — Discharge Instructions (Signed)
Keep the dermabond (glue) clean and dry. DO NOT GET SOAPS, SHAMPOOS OR USE OINTMENTS ON THE WOUND OR THE GLUE WILL DISSOLVE TOO QUICKLY. Wear the splint until you can see the orthopedist on call, Dr Alain Marion. Call his office in the morning to get an appointment. Elevate your hand. Use ice packs for swelling and pain relief. Take ibuprofen 600 mg 4 times a day and use the hydrocodone for pain not controlled by the ibuprofen.  Return to the ED for any problems listed on the head injury sheet.

## 2017-05-08 NOTE — Progress Notes (Signed)
Orthopedic Tech Progress Note Patient Details:  Roger Jennings 1976-04-27 [id number]  Ortho Devices Type of Ortho Device: Ulna gutter splint Ortho Device/Splint Location: Right Ortho Device/Splint Interventions: Application, Adjustment   Kristopher Oppenheim 05/08/2017, 2:51 AM

## 2017-05-11 DIAGNOSIS — M25649 Stiffness of unspecified hand, not elsewhere classified: Secondary | ICD-10-CM | POA: Insufficient documentation

## 2017-05-11 DIAGNOSIS — M79644 Pain in right finger(s): Secondary | ICD-10-CM | POA: Insufficient documentation

## 2017-05-31 ENCOUNTER — Ambulatory Visit (INDEPENDENT_AMBULATORY_CARE_PROVIDER_SITE_OTHER): Payer: Self-pay | Admitting: Urgent Care

## 2017-05-31 ENCOUNTER — Encounter: Payer: Self-pay | Admitting: Urgent Care

## 2017-05-31 ENCOUNTER — Telehealth: Payer: Self-pay

## 2017-05-31 VITALS — BP 131/84 | HR 77 | Temp 98.4°F | Resp 18 | Ht 70.0 in | Wt 235.2 lb

## 2017-05-31 DIAGNOSIS — Z8719 Personal history of other diseases of the digestive system: Secondary | ICD-10-CM

## 2017-05-31 DIAGNOSIS — F419 Anxiety disorder, unspecified: Secondary | ICD-10-CM

## 2017-05-31 DIAGNOSIS — R457 State of emotional shock and stress, unspecified: Secondary | ICD-10-CM

## 2017-05-31 DIAGNOSIS — Z638 Other specified problems related to primary support group: Secondary | ICD-10-CM

## 2017-05-31 DIAGNOSIS — R1032 Left lower quadrant pain: Secondary | ICD-10-CM

## 2017-05-31 DIAGNOSIS — G4700 Insomnia, unspecified: Secondary | ICD-10-CM

## 2017-05-31 MED ORDER — TRAZODONE HCL 50 MG PO TABS: 50.0000 mg | ORAL_TABLET | Freq: Every evening | ORAL | 3 refills | Status: DC | PRN

## 2017-05-31 MED ORDER — ALPRAZOLAM 0.25 MG PO TABS: 0.2500 mg | ORAL_TABLET | Freq: Every day | ORAL | 0 refills | Status: DC | PRN

## 2017-05-31 MED ORDER — FLUOXETINE HCL 20 MG PO CAPS: 20.0000 mg | ORAL_CAPSULE | Freq: Every day | ORAL | 1 refills | Status: DC

## 2017-05-31 NOTE — Patient Instructions (Addendum)
For insomnia, try 50mg  benadryl nightly. If this does not help, then you can fill the script for trazodone. If you start Prozac and tolerate it but continue to have issues with anxiety 3-4 weeks out, let me know. Our next step should be to double your dose of Prozac from 20mg  to 40mg  by either doubling the number of capsules you take per day or writing you a new script for 40mg  capsules (they cost the same).     Fluoxetine capsules or tablets (Depression/Mood Disorders) What is this medicine? FLUOXETINE (floo OX e teen) belongs to a class of drugs known as selective serotonin reuptake inhibitors (SSRIs). It helps to treat mood problems such as depression, obsessive compulsive disorder, and panic attacks. It can also treat certain eating disorders. This medicine may be used for other purposes; ask your health care provider or pharmacist if you have questions. COMMON BRAND NAME(S): Prozac What should I tell my health care provider before I take this medicine? They need to know if you have any of these conditions: -bipolar disorder or a family history of bipolar disorder -bleeding disorders -glaucoma -heart disease -liver disease -low levels of sodium in the blood -seizures -suicidal thoughts, plans, or attempt; a previous suicide attempt by you or a family member -take MAOIs like Carbex, Eldepryl, Marplan, Nardil, and Parnate -take medicines that treat or prevent blood clots -thyroid disease -an unusual or allergic reaction to fluoxetine, other medicines, foods, dyes, or preservatives -pregnant or trying to get pregnant -breast-feeding How should I use this medicine? Take this medicine by mouth with a glass of water. Follow the directions on the prescription label. You can take this medicine with or without food. Take your medicine at regular intervals. Do not take it more often than directed. Do not stop taking this medicine suddenly except upon the advice of your doctor. Stopping this  medicine too quickly may cause serious side effects or your condition may worsen. A special MedGuide will be given to you by the pharmacist with each prescription and refill. Be sure to read this information carefully each time. Talk to your pediatrician regarding the use of this medicine in children. While this drug may be prescribed for children as young as 7 years for selected conditions, precautions do apply. Overdosage: If you think you have taken too much of this medicine contact a poison control center or emergency room at once. NOTE: This medicine is only for you. Do not share this medicine with others. What if I miss a dose? If you miss a dose, skip the missed dose and go back to your regular dosing schedule. Do not take double or extra doses. What may interact with this medicine? Do not take this medicine with any of the following medications: -other medicines containing fluoxetine, like Sarafem or Symbyax -cisapride -linezolid -MAOIs like Carbex, Eldepryl, Marplan, Nardil, and Parnate -methylene blue (injected into a vein) -pimozide -thioridazine This medicine may also interact with the following medications: -alcohol -amphetamines -aspirin and aspirin-like medicines -carbamazepine -certain medicines for depression, anxiety, or psychotic disturbances -certain medicines for migraine headaches like almotriptan, eletriptan, frovatriptan, naratriptan, rizatriptan, sumatriptan, zolmitriptan -digoxin -diuretics -fentanyl -flecainide -furazolidone -isoniazid -lithium -medicines for sleep -medicines that treat or prevent blood clots like warfarin, enoxaparin, and dalteparin -NSAIDs, medicines for pain and inflammation, like ibuprofen or naproxen -phenytoin -procarbazine -propafenone -rasagiline -ritonavir -supplements like St. John's wort, kava kava, valerian -tramadol -tryptophan -vinblastine This list may not describe all possible interactions. Give your health care  provider a list of  all the medicines, herbs, non-prescription drugs, or dietary supplements you use. Also tell them if you smoke, drink alcohol, or use illegal drugs. Some items may interact with your medicine. What should I watch for while using this medicine? Tell your doctor if your symptoms do not get better or if they get worse. Visit your doctor or health care professional for regular checks on your progress. Because it may take several weeks to see the full effects of this medicine, it is important to continue your treatment as prescribed by your doctor. Patients and their families should watch out for new or worsening thoughts of suicide or depression. Also watch out for sudden changes in feelings such as feeling anxious, agitated, panicky, irritable, hostile, aggressive, impulsive, severely restless, overly excited and hyperactive, or not being able to sleep. If this happens, especially at the beginning of treatment or after a change in dose, call your health care professional. Dennis Bast may get drowsy or dizzy. Do not drive, use machinery, or do anything that needs mental alertness until you know how this medicine affects you. Do not stand or sit up quickly, especially if you are an older patient. This reduces the risk of dizzy or fainting spells. Alcohol may interfere with the effect of this medicine. Avoid alcoholic drinks. Your mouth may get dry. Chewing sugarless gum or sucking hard candy, and drinking plenty of water may help. Contact your doctor if the problem does not go away or is severe. This medicine may affect blood sugar levels. If you have diabetes, check with your doctor or health care professional before you change your diet or the dose of your diabetic medicine. What side effects may I notice from receiving this medicine? Side effects that you should report to your doctor or health care professional as soon as possible: -allergic reactions like skin rash, itching or hives, swelling of  the face, lips, or tongue -anxious -black, tarry stools -breathing problems -changes in vision -confusion -elevated mood, decreased need for sleep, racing thoughts, impulsive behavior -eye pain -fast, irregular heartbeat -feeling faint or lightheaded, falls -feeling agitated, angry, or irritable -hallucination, loss of contact with reality -loss of balance or coordination -loss of memory -painful or prolonged erections -restlessness, pacing, inability to keep still -seizures -stiff muscles -suicidal thoughts or other mood changes -trouble sleeping -unusual bleeding or bruising -unusually weak or tired -vomiting Side effects that usually do not require medical attention (report to your doctor or health care professional if they continue or are bothersome): -change in appetite or weight -change in sex drive or performance -diarrhea -dry mouth -headache -increased sweating -nausea -tremors This list may not describe all possible side effects. Call your doctor for medical advice about side effects. You may report side effects to FDA at 1-800-FDA-1088. Where should I keep my medicine? Keep out of the reach of children. Store at room temperature between 15 and 30 degrees C (59 and 86 degrees F). Throw away any unused medicine after the expiration date. NOTE: This sheet is a summary. It may not cover all possible information. If you have questions about this medicine, talk to your doctor, pharmacist, or health care provider.  2018 Elsevier/Gold Standard (2016-02-27 15:55:27)    Trazodone tablets What is this medicine? TRAZODONE (TRAZ oh done) is used to treat depression. This medicine may be used for other purposes; ask your health care provider or pharmacist if you have questions. COMMON BRAND NAME(S): Desyrel What should I tell my health care provider before I take this medicine?  They need to know if you have any of these conditions: -attempted suicide or thinking about  it -bipolar disorder -bleeding problems -glaucoma -heart disease, or previous heart attack -irregular heart beat -kidney or liver disease -low levels of sodium in the blood -an unusual or allergic reaction to trazodone, other medicines, foods, dyes or preservatives -pregnant or trying to get pregnant -breast-feeding How should I use this medicine? Take this medicine by mouth with a glass of water. Follow the directions on the prescription label. Take this medicine shortly after a meal or a light snack. Take your medicine at regular intervals. Do not take your medicine more often than directed. Do not stop taking this medicine suddenly except upon the advice of your doctor. Stopping this medicine too quickly may cause serious side effects or your condition may worsen. A special MedGuide will be given to you by the pharmacist with each prescription and refill. Be sure to read this information carefully each time. Talk to your pediatrician regarding the use of this medicine in children. Special care may be needed. Overdosage: If you think you have taken too much of this medicine contact a poison control center or emergency room at once. NOTE: This medicine is only for you. Do not share this medicine with others. What if I miss a dose? If you miss a dose, take it as soon as you can. If it is almost time for your next dose, take only that dose. Do not take double or extra doses. What may interact with this medicine? Do not take this medicine with any of the following medications: -certain medicines for fungal infections like fluconazole, itraconazole, ketoconazole, posaconazole, voriconazole -cisapride -dofetilide -dronedarone -linezolid -MAOIs like Carbex, Eldepryl, Marplan, Nardil, and Parnate -mesoridazine -methylene blue (injected into a vein) -pimozide -saquinavir -thioridazine -ziprasidone This medicine may also interact with the following medications: -alcohol -antiviral  medicines for HIV or AIDS -aspirin and aspirin-like medicines -barbiturates like phenobarbital -certain medicines for blood pressure, heart disease, irregular heart beat -certain medicines for depression, anxiety, or psychotic disturbances -certain medicines for migraine headache like almotriptan, eletriptan, frovatriptan, naratriptan, rizatriptan, sumatriptan, zolmitriptan -certain medicines for seizures like carbamazepine and phenytoin -certain medicines for sleep -certain medicines that treat or prevent blood clots like dalteparin, enoxaparin, warfarin -digoxin -fentanyl -lithium -NSAIDS, medicines for pain and inflammation, like ibuprofen or naproxen -other medicines that prolong the QT interval (cause an abnormal heart rhythm) -rasagiline -supplements like St. John's wort, kava kava, valerian -tramadol -tryptophan This list may not describe all possible interactions. Give your health care provider a list of all the medicines, herbs, non-prescription drugs, or dietary supplements you use. Also tell them if you smoke, drink alcohol, or use illegal drugs. Some items may interact with your medicine. What should I watch for while using this medicine? Tell your doctor if your symptoms do not get better or if they get worse. Visit your doctor or health care professional for regular checks on your progress. Because it may take several weeks to see the full effects of this medicine, it is important to continue your treatment as prescribed by your doctor. Patients and their families should watch out for new or worsening thoughts of suicide or depression. Also watch out for sudden changes in feelings such as feeling anxious, agitated, panicky, irritable, hostile, aggressive, impulsive, severely restless, overly excited and hyperactive, or not being able to sleep. If this happens, especially at the beginning of treatment or after a change in dose, call your health care professional. You may  get drowsy  or dizzy. Do not drive, use machinery, or do anything that needs mental alertness until you know how this medicine affects you. Do not stand or sit up quickly, especially if you are an older patient. This reduces the risk of dizzy or fainting spells. Alcohol may interfere with the effect of this medicine. Avoid alcoholic drinks. This medicine may cause dry eyes and blurred vision. If you wear contact lenses you may feel some discomfort. Lubricating drops may help. See your eye doctor if the problem does not go away or is severe. Your mouth may get dry. Chewing sugarless gum, sucking hard candy and drinking plenty of water may help. Contact your doctor if the problem does not go away or is severe. What side effects may I notice from receiving this medicine? Side effects that you should report to your doctor or health care professional as soon as possible: -allergic reactions like skin rash, itching or hives, swelling of the face, lips, or tongue -elevated mood, decreased need for sleep, racing thoughts, impulsive behavior -confusion -fast, irregular heartbeat -feeling faint or lightheaded, falls -feeling agitated, angry, or irritable -loss of balance or coordination -painful or prolonged erections -restlessness, pacing, inability to keep still -suicidal thoughts or other mood changes -tremors -trouble sleeping -seizures -unusual bleeding or bruising Side effects that usually do not require medical attention (report to your doctor or health care professional if they continue or are bothersome): -change in sex drive or performance -change in appetite or weight -constipation -headache -muscle aches or pains -nausea This list may not describe all possible side effects. Call your doctor for medical advice about side effects. You may report side effects to FDA at 1-800-FDA-1088. Where should I keep my medicine? Keep out of the reach of children. Store at room temperature between 15 and 30  degrees C (59 to 86 degrees F). Protect from light. Keep container tightly closed. Throw away any unused medicine after the expiration date. NOTE: This sheet is a summary. It may not cover all possible information. If you have questions about this medicine, talk to your doctor, pharmacist, or health care provider.  2018 Elsevier/Gold Standard (2016-02-25 16:57:05)    Alprazolam tablets What is this medicine? ALPRAZOLAM (al PRAY zoe lam) is a benzodiazepine. It is used to treat anxiety and panic attacks. This medicine may be used for other purposes; ask your health care provider or pharmacist if you have questions. COMMON BRAND NAME(S): Xanax What should I tell my health care provider before I take this medicine? They need to know if you have any of these conditions: -an alcohol or drug abuse problem -bipolar disorder, depression, psychosis or other mental health conditions -glaucoma -kidney or liver disease -lung or breathing disease -myasthenia gravis -Parkinson's disease -porphyria -seizures or a history of seizures -suicidal thoughts -an unusual or allergic reaction to alprazolam, other benzodiazepines, foods, dyes, or preservatives -pregnant or trying to get pregnant -breast-feeding How should I use this medicine? Take this medicine by mouth with a glass of water. Follow the directions on the prescription label. Take your medicine at regular intervals. Do not take it more often than directed. Do not stop taking except on your doctor's advice. A special MedGuide will be given to you by the pharmacist with each prescription and refill. Be sure to read this information carefully each time. Talk to your pediatrician regarding the use of this medicine in children. Special care may be needed. Overdosage: If you think you have taken too much of this  medicine contact a poison control center or emergency room at once. NOTE: This medicine is only for you. Do not share this medicine with  others. What if I miss a dose? If you miss a dose, take it as soon as you can. If it is almost time for your next dose, take only that dose. Do not take double or extra doses. What may interact with this medicine? Do not take this medicine with any of the following medications: -certain antiviral medicines for HIV or AIDS like delavirdine, indinavir -certain medicines for fungal infections like ketoconazole and itraconazole -narcotic medicines for cough -sodium oxybate This medicine may also interact with the following medications: -alcohol -antihistamines for allergy, cough and cold -certain antibiotics like clarithromycin, erythromycin, isoniazid, rifampin, rifapentine, rifabutin, and troleandomycin -certain medicines for blood pressure, heart disease, irregular heart beat -certain medicines for depression, like amitriptyline, fluoxetine, sertraline -certain medicines for seizures like carbamazepine, oxcarbazepine, phenobarbital, phenytoin, primidone -cimetidine -cyclosporine -male hormones, like estrogens or progestins and birth control pills, patches, rings, or injections -general anesthetics like halothane, isoflurane, methoxyflurane, propofol -grapefruit juice -local anesthetics like lidocaine, pramoxine, tetracaine -medicines that relax muscles for surgery -narcotic medicines for pain -other antiviral medicines for HIV or AIDS -phenothiazines like chlorpromazine, mesoridazine, prochlorperazine, thioridazine This list may not describe all possible interactions. Give your health care provider a list of all the medicines, herbs, non-prescription drugs, or dietary supplements you use. Also tell them if you smoke, drink alcohol, or use illegal drugs. Some items may interact with your medicine. What should I watch for while using this medicine? Tell your doctor or health care professional if your symptoms do not start to get better or if they get worse. Do not stop taking except on  your doctor's advice. You may develop a severe reaction. Your doctor will tell you how much medicine to take. You may get drowsy or dizzy. Do not drive, use machinery, or do anything that needs mental alertness until you know how this medicine affects you. To reduce the risk of dizzy and fainting spells, do not stand or sit up quickly, especially if you are an older patient. Alcohol may increase dizziness and drowsiness. Avoid alcoholic drinks. If you are taking another medicine that also causes drowsiness, you may have more side effects. Give your health care provider a list of all medicines you use. Your doctor will tell you how much medicine to take. Do not take more medicine than directed. Call emergency for help if you have problems breathing or unusual sleepiness. What side effects may I notice from receiving this medicine? Side effects that you should report to your doctor or health care professional as soon as possible: -allergic reactions like skin rash, itching or hives, swelling of the face, lips, or tongue -breathing problems -confusion -loss of balance or coordination -signs and symptoms of low blood pressure like dizziness; feeling faint or lightheaded, falls; unusually weak or tired -suicidal thoughts or other mood changes Side effects that usually do not require medical attention (report to your doctor or health care professional if they continue or are bothersome): -dizziness -dry mouth -nausea, vomiting -tiredness This list may not describe all possible side effects. Call your doctor for medical advice about side effects. You may report side effects to FDA at 1-800-FDA-1088. Where should I keep my medicine? Keep out of the reach of children. This medicine can be abused. Keep your medicine in a safe place to protect it from theft. Do not share this medicine with anyone. Selling  or giving away this medicine is dangerous and against the law. Store at room temperature between 20 and  25 degrees C (68 and 77 degrees F). This medicine may cause accidental overdose and death if taken by other adults, children, or pets. Mix any unused medicine with a substance like cat litter or coffee grounds. Then throw the medicine away in a sealed container like a sealed bag or a coffee can with a lid. Do not use the medicine after the expiration date. NOTE: This sheet is a summary. It may not cover all possible information. If you have questions about this medicine, talk to your doctor, pharmacist, or health care provider.  2018 Elsevier/Gold Standard (2015-06-25 13:47:25)     IF you received an x-ray today, you will receive an invoice from [facility]. Please contact [facility] at [phone] with questions or concerns regarding your invoice.   IF you received labwork today, you will receive an invoice from [facility]. Please contact [facility] at [phone] with questions or concerns regarding your invoice.   Our billing staff will not be able to assist you with questions regarding bills from these companies.  You will be contacted with the lab results as soon as they are available. The fastest way to get your results is to activate your My Chart account. Instructions are located on the last page of this paperwork. If you have not heard from [place] regarding the results in 2 weeks, please contact this office.

## 2017-05-31 NOTE — Telephone Encounter (Signed)
Pt rx's for alprazolam 0.25 mg and tramadol 50 mg faxed to [facility] on [address] at [phone].  Also mailed rx for trazodone 50 mg to pt home address at: [email or web address]. dg
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
## 2017-06-01 LAB — COMPREHENSIVE METABOLIC PANEL
A/G RATIO: 1.9 (ref 1.2–2.2)
ALBUMIN: 5 g/dL (ref 3.5–5.5)
ALK PHOS: 65 IU/L (ref 39–117)
ALT: 27 IU/L (ref 0–44)
AST: 19 IU/L (ref 0–40)
BUN / CREAT RATIO: 15 (ref 9–20)
BUN: 16 mg/dL (ref 6–24)
Bilirubin Total: 0.7 mg/dL (ref 0.0–1.2)
CALCIUM: 10.1 mg/dL (ref 8.7–10.2)
CO2: 22 mmol/L (ref 20–29)
CREATININE: 1.08 mg/dL (ref 0.76–1.27)
Chloride: 104 mmol/L (ref 96–106)
GFR calc Af Amer: 99 mL/min/{1.73_m2} (ref >59)
GFR, EST NON AFRICAN AMERICAN: 85 mL/min/{1.73_m2} (ref >59)
GLOBULIN, TOTAL: 2.7 g/dL (ref 1.5–4.5)
Glucose: 98 mg/dL (ref 65–99)
POTASSIUM: 3.9 mmol/L (ref 3.5–5.2)
SODIUM: 142 mmol/L (ref 134–144)
Total Protein: 7.7 g/dL (ref 6.0–8.5)

## 2017-06-01 LAB — CBC
Hematocrit: 45.5 % (ref 37.5–51.0)
Hemoglobin: 15.1 g/dL (ref 13.0–17.7)
MCH: 29.8 pg (ref 26.6–33.0)
MCHC: 33.2 g/dL (ref 31.5–35.7)
MCV: 90 fL (ref 79–97)
PLATELETS: 173 10*3/uL (ref 150–379)
RBC: 5.07 x10E6/uL (ref 4.14–5.80)
RDW: 14.3 % (ref 12.3–15.4)
WBC: 7 10*3/uL (ref 3.4–10.8)

## 2017-06-01 LAB — TSH: TSH: 1.64 u[IU]/mL (ref 0.450–4.500)

## 2017-06-09 ENCOUNTER — Ambulatory Visit: Payer: Self-pay | Admitting: Family Medicine

## 2017-06-20 ENCOUNTER — Telehealth: Payer: Self-pay | Admitting: Podiatry

## 2017-06-20 NOTE — Telephone Encounter (Signed)
I was calling to see if Dr. Jacqualyn Posey could refill my allopurinol prescription. [facility] Dr. Milinda Pointer is my regular doctor there.

## 2017-06-21 MED ORDER — ALLOPURINOL 100 MG PO TABS: 100.0000 mg | ORAL_TABLET | Freq: Every day | ORAL | 6 refills | Status: DC

## 2017-06-21 NOTE — Addendum Note (Signed)
Addended by: Harriett Sine D on: 06/21/2017 11:37 AM   Modules accepted: Orders

## 2017-06-21 NOTE — Telephone Encounter (Signed)
OK to refill  Thanks

## 2017-06-21 NOTE — Telephone Encounter (Signed)
I informed pt Dr. Jacqualyn Posey had refilled the Allopurinol.

## 2017-07-13 ENCOUNTER — Ambulatory Visit: Payer: Self-pay | Admitting: Urgent Care

## 2018-01-25 ENCOUNTER — Telehealth: Payer: Self-pay | Admitting: Podiatry

## 2018-01-25 MED ORDER — ALLOPURINOL 100 MG PO TABS: 100.0000 mg | ORAL_TABLET | Freq: Every day | ORAL | 6 refills | Status: DC

## 2018-01-25 NOTE — Telephone Encounter (Signed)
I was calling to see if I can get a refill of the allopurinol. I saw Dr. Milinda Pointer but the last time I was seen, I was seen by Dr. Jacqualyn Posey at the [facility] clinic because I don't have insurance. Dr. Jacqualyn Posey prescribed me the allopurinol and I'm out of refills and I have about 15-20 pills left. I know the last time I talked to somebody they talked about me coming in for an appointment. Just give me a call back and let me know what I need to do. The phone number is [phone]. Thanks.

## 2018-01-25 NOTE — Addendum Note (Signed)
Addended by: Harriett Sine D on: 01/25/2018 05:11 PM   Modules accepted: Orders

## 2018-01-25 NOTE — Telephone Encounter (Signed)
Yes I called this morning about getting a refill on my medication as I'm almost out. If you would please call me back at [phone]. Thank you.

## 2018-01-25 NOTE — Telephone Encounter (Signed)
I informed Dr. Jacqualyn Posey had refilled the allopurinol.

## 2018-01-25 NOTE — Telephone Encounter (Signed)
Ok to refill the allopurinol I did earlier this year. Please tell him to come to the next clinic I have in June and we will re-order some blood work. Thanks.

## 2018-09-18 ENCOUNTER — Telehealth: Payer: Self-pay | Admitting: Podiatry

## 2018-09-18 DIAGNOSIS — M109 Gout, unspecified: Secondary | ICD-10-CM

## 2018-09-18 NOTE — Telephone Encounter (Signed)
He needs to come in to be seen and we need to do blood work.

## 2018-09-18 NOTE — Telephone Encounter (Signed)
Pt called requesting refill of Allopurinol, pt was last seen on 11/14/2016. Please give pt a call.

## 2018-09-19 NOTE — Telephone Encounter (Signed)
I informed pt of Dr. Leigh Aurora orders. Pt states he would only be able to see Dr. Jacqualyn Posey at his clinic time then. I asked pt if that was the [facility] and he said yes. I told pt he could call the center and get established with their PCP and get blood work and be evaluated for why he continued to have flares. Pt states he was just trying to get the medication to prevent him from having flares of gout and asked if he could get a refill until he could be seen by Dr. Jacqualyn Posey at his next month clinic. I told him I would inform Dr. Jacqualyn Posey and he should look into get a PCP appt.

## 2018-09-20 MED ORDER — ALLOPURINOL 100 MG PO TABS: 100.0000 mg | ORAL_TABLET | Freq: Every day | ORAL | 0 refills | Status: DC

## 2018-09-20 NOTE — Telephone Encounter (Signed)
Please refill for a total of one month supply. Can you put in an order to check uric acid and basic metabolic panel and have him go by the [facility] to get it done if they will do it.  He must come in to the next clinic or have him see me in our [place] office.

## 2018-09-20 NOTE — Telephone Encounter (Signed)
I informed Roger Jennings of Dr. Leigh Aurora orders and that he wanted Roger Jennings to be seen at the next [facility] or in our office. Roger Jennings states understanding.

## 2018-09-20 NOTE — Addendum Note (Signed)
Addended by: Harriett Sine D on: 09/20/2018 04:30 PM   Modules accepted: Orders

## 2018-10-01 ENCOUNTER — Ambulatory Visit: Payer: Self-pay | Attending: Family Medicine

## 2018-10-01 ENCOUNTER — Other Ambulatory Visit: Payer: Self-pay | Admitting: Podiatry

## 2018-10-01 DIAGNOSIS — M109 Gout, unspecified: Secondary | ICD-10-CM | POA: Insufficient documentation

## 2018-10-01 NOTE — Progress Notes (Signed)
Patient here for lab visit only 

## 2018-10-02 LAB — BASIC METABOLIC PANEL
BUN/Creatinine Ratio: 15 (ref 9–20)
BUN: 18 mg/dL (ref 6–24)
CALCIUM: 9.9 mg/dL (ref 8.7–10.2)
CHLORIDE: 103 mmol/L (ref 96–106)
CO2: 20 mmol/L (ref 20–29)
Creatinine, Ser: 1.17 mg/dL (ref 0.76–1.27)
GFR calc Af Amer: 88 mL/min/{1.73_m2} (ref >59)
GFR calc non Af Amer: 76 mL/min/{1.73_m2} (ref >59)
Glucose: 92 mg/dL (ref 65–99)
POTASSIUM: 4.2 mmol/L (ref 3.5–5.2)
Sodium: 142 mmol/L (ref 134–144)

## 2018-10-02 LAB — URIC ACID: URIC ACID: 8.4 mg/dL (ref 3.7–8.6)

## 2018-10-05 ENCOUNTER — Telehealth: Payer: Self-pay | Admitting: *Deleted

## 2018-10-05 NOTE — Telephone Encounter (Signed)
-----   Message from Trula Slade, DPM sent at 10/04/2018 11:58 AM EST ----- Val- please let him know that his labs are stable. Creatine and uric acid are normal. Continue allopurinol and OK to refill. Have him come in to be seen. Thanks.

## 2018-10-05 NOTE — Telephone Encounter (Signed)
We will do February most likely. Please ask [place] Mon. We were working on a date.

## 2018-10-05 NOTE — Telephone Encounter (Signed)
I informed pt of Dr. Leigh Aurora review of results and orders. Pt requested Allopurinol refill and date of Dr. Leigh Aurora next [facility]. I told pt I would have to call again with the clinic information.

## 2018-10-16 ENCOUNTER — Telehealth: Payer: Self-pay | Admitting: Podiatry

## 2018-10-16 MED ORDER — ALLOPURINOL 100 MG PO TABS: 100.0000 mg | ORAL_TABLET | Freq: Every day | ORAL | 0 refills | Status: DC

## 2018-10-16 NOTE — Telephone Encounter (Signed)
Pt called for the message I informed of the message left and he would receive a call from Dr. Leigh Aurora assistant.

## 2018-10-16 NOTE — Telephone Encounter (Signed)
I'm calling to see if [facility] [address] and also the status of getting to see Dr. Jacqualyn Posey at the free clinic. Please call me back at [phone].

## 2018-10-16 NOTE — Addendum Note (Signed)
Addended by: Harriett Sine D on: 10/16/2018 04:41 PM   Modules accepted: Orders

## 2018-10-16 NOTE — Telephone Encounter (Signed)
Left message informing pt the Allopurinol had been okayed for refill by Dr. Jacqualyn Posey and I would have his assistant call with the date and hours of the clinic.

## 2018-10-16 NOTE — Addendum Note (Signed)
Addended by: Harriett Sine D on: 10/16/2018 04:36 PM   Modules accepted: Orders

## 2018-10-17 NOTE — Telephone Encounter (Signed)
Sent [employer]) a note stating that the patient would like to come in to see Dr Jacqualyn Posey and I stated that I would have her call the patient to set up a time. Lattie Haw

## 2018-11-05 ENCOUNTER — Ambulatory Visit: Payer: Self-pay | Attending: Podiatry | Admitting: Podiatry

## 2018-11-05 DIAGNOSIS — M109 Gout, unspecified: Secondary | ICD-10-CM

## 2018-11-05 MED ORDER — ALLOPURINOL 100 MG PO TABS: 100.0000 mg | ORAL_TABLET | Freq: Every day | ORAL | 0 refills | Status: DC

## 2018-11-07 NOTE — Progress Notes (Signed)
Subjective: 43 year old male [facility] for follow-up evaluation of gout.  He states he is having no issues.  He is almost out of allopurinol and asking for refill.  Denies any recent increase in swelling or any redness.  He states he gets about 1-2 gout flares a year currently on this dose of allopurinol.  He has not been since October since his last flare. Denies any systemic complaints such as fevers, chills, nausea, vomiting. No acute changes since last appointment, and no other complaints at this time.   Objective: AAO x3, NAD DP/PT pulses palpable bilaterally, CRT less than 3 seconds At this time there is no edema, erythema, increase in warmth bilaterally.  No area of tenderness there is no active signs of gout. There are no open lesions or pre-ulcerative lesions.  No pain with calf compression, swelling, warmth, erythema  Assessment: History of gout, currently on allopurinol  Plan: -All treatment options discussed with the patient including all alternatives, risks, complications.  -Overall he is doing well.  I refilled allopurinol for 3 months.  I will see him back at that point to recheck blood work.  If he starts to get more flares we can increase the dose if needed. -Patient encouraged to call the office with any questions, concerns, change in symptoms.   Trula Slade DPM

## 2018-11-19 ENCOUNTER — Telehealth: Payer: Self-pay | Admitting: Podiatry

## 2018-11-19 NOTE — Telephone Encounter (Signed)
I saw Dr. Jacqualyn Posey at the wellness center the other week and he was supposed to call in some prescriptions. I have not heard anything from my pharmacy

## 2018-11-19 NOTE — Telephone Encounter (Signed)
I had sent allopurinol into the pharmacy for him. Can you confirm his pharmacy? Thanks.

## 2018-11-20 NOTE — Telephone Encounter (Addendum)
Left message informing pt the medication had been sent to [facility] on [address] 11/05/2018 at 6:27pm confirmed received.

## 2018-11-21 ENCOUNTER — Telehealth: Payer: Self-pay | Admitting: *Deleted

## 2018-11-21 DIAGNOSIS — M109 Gout, unspecified: Secondary | ICD-10-CM

## 2018-11-21 NOTE — Telephone Encounter (Signed)
[facility] [address] I told pt the rx had been called to the [facility] on [address] and he states he will pick it up at the [facility], but would [facility]. Pt also asked if a referral had been made for him, Dr. Jacqualyn Posey had discussed with him at his last visit.

## 2018-11-21 NOTE — Telephone Encounter (Signed)
It was for a primary care physician. I had spoken with them at the [facility] but I guess they didn't do it. He does not have a PCP.

## 2018-11-22 NOTE — Telephone Encounter (Signed)
Faxed referral, clinicals and demographics to [facility].

## 2018-11-22 NOTE — Telephone Encounter (Signed)
I informed pt of Dr. Leigh Aurora referral to [facility]. Pt requested the [facility] phone be emailed to [email or web address].

## 2018-11-22 NOTE — Telephone Encounter (Signed)
Done

## 2019-02-18 ENCOUNTER — Telehealth: Payer: Self-pay | Admitting: *Deleted

## 2019-02-18 DIAGNOSIS — M109 Gout, unspecified: Secondary | ICD-10-CM

## 2019-02-18 MED ORDER — ALLOPURINOL 100 MG PO TABS: 100.0000 mg | ORAL_TABLET | Freq: Every day | ORAL | 0 refills | Status: DC

## 2019-02-18 NOTE — Telephone Encounter (Signed)
Dr. Jacqualyn Posey states due to the pandemic they are uncertain when [facility] will be taking podiatry appts, refill pt's allopurinol as previously and order labs. I informed pt and he states understanding.

## 2019-02-18 NOTE — Telephone Encounter (Signed)
Pt requested refill of the Allopurinol.

## 2019-02-21 ENCOUNTER — Telehealth: Payer: Self-pay | Admitting: *Deleted

## 2019-02-21 NOTE — Telephone Encounter (Signed)
Pt states his is having a flare of gout in his right great toe and called the office for cost of appt today. Pt states he is unable to pay $200.00 all at once, but would like something for pain and swelling.

## 2019-02-21 NOTE — Telephone Encounter (Signed)
I spoke to pt concerning request for right 1st toe flare of gout.

## 2019-02-21 NOTE — Telephone Encounter (Signed)
Please have him come in Friday morning to see me. Will be happy to see him.

## 2019-02-22 ENCOUNTER — Other Ambulatory Visit: Payer: Self-pay

## 2019-02-22 ENCOUNTER — Encounter: Payer: Self-pay | Admitting: Podiatry

## 2019-02-22 ENCOUNTER — Ambulatory Visit (INDEPENDENT_AMBULATORY_CARE_PROVIDER_SITE_OTHER): Payer: Self-pay | Admitting: Podiatry

## 2019-02-22 DIAGNOSIS — M779 Enthesopathy, unspecified: Secondary | ICD-10-CM

## 2019-02-22 DIAGNOSIS — M109 Gout, unspecified: Secondary | ICD-10-CM

## 2019-02-22 MED ORDER — INDOMETHACIN 50 MG PO CAPS: 50.0000 mg | ORAL_CAPSULE | Freq: Two times a day (BID) | ORAL | 0 refills | Status: DC

## 2019-02-22 NOTE — Telephone Encounter (Signed)
I spoke with Dr. Jacqualyn Posey and he said office manager, Rudene Christians spoke with pt last night.

## 2019-02-24 NOTE — Progress Notes (Signed)
Subjective: 43 year old male presents the office today for concerns of gout to his right big toe joint.  He typically gets a gout flare in the left foot however over the last couple days she started noticing the right foot has become swollen or red and states that it feels the exact same as it has previously been gout.  He is currently taking allopurinol 100 mg daily.  No other concerns today and no recent injury.Denies any systemic complaints such as fevers, chills, nausea, vomiting. No acute changes since last appointment, and no other complaints at this time.   Objective: AAO x3, NAD DP/PT pulses palpable bilaterally, CRT less than 3 seconds There is localized edema and erythema on the right first MPJ.  There is pain with MPJ range of motion.  No ascending cellulitis.  No fluctuation crepitation any malodor. No open lesions or pre-ulcerative lesions.  No pain with calf compression, swelling, warmth, erythema  Assessment: Capsulitis, gout right first MPJ  Plan: -All treatment options discussed with the patient including all alternatives, risks, complications.  -Steroid injection performed on the right foot.  See procedure note below. -We will increase his allopurinol to 200 mg daily. -Indomethacin prescribed.  Discussed side effects of medication. -We will repeat blood work in the next 2 weeks. Order already completed.  -Patient encouraged to call the office with any questions, concerns, change in symptoms.   Trula Slade DPM

## 2019-02-25 ENCOUNTER — Telehealth: Payer: Self-pay | Admitting: *Deleted

## 2019-02-25 NOTE — Telephone Encounter (Signed)
Stop the medication. Lets give it a day or so to see how he did from the injection and if still having issues then we can do a medrol dose pack.

## 2019-02-25 NOTE — Telephone Encounter (Signed)
I informed pt of Dr. Leigh Aurora recommendations and he states he did get some relief Friday, but it is still difficult to walk on. I instructed pt to go into a stiff bottom shoe to decrease the bend at the ball of the foot and big toe and to call again Wednesday. Pt states understanding.

## 2019-02-25 NOTE — Telephone Encounter (Signed)
Pt states the medication Dr. Jacqualyn Posey prescribed Friday is not helping and I tearing up his stomach.  I asked pt if he was taking the indomethacin with food and separate from the allopurinol. Pt states he does take with foot and separate from the other medication. I told pt I would inform Dr. Jacqualyn Posey and call again.

## 2019-02-27 ENCOUNTER — Telehealth: Payer: Self-pay | Admitting: *Deleted

## 2019-02-27 MED ORDER — METHYLPREDNISOLONE 4 MG PO TBPK: ORAL_TABLET | ORAL | 0 refills | Status: DC

## 2019-02-27 NOTE — Addendum Note (Signed)
Addended by: Harriett Sine D on: 02/27/2019 04:32 PM   Modules accepted: Orders

## 2019-02-27 NOTE — Telephone Encounter (Signed)
Pt called to see if Dr. Jacqualyn Posey had given any change of therapy. I told pt Dr. Jacqualyn Posey was in surgery and I would call again once he had responded to the message.

## 2019-02-27 NOTE — Telephone Encounter (Signed)
Pt states his foot is not getting any better, and Dr. Jacqualyn Posey had told him to call if there was no improvement.

## 2019-02-27 NOTE — Telephone Encounter (Signed)
I reviewed pt's telephone message from 02/25/2019 and Dr. Jacqualyn Posey had stated if pt does not improve order Medrol dose pack. I called pt and informed of Dr. Leigh Aurora orders from 02/25/2019 and [id number]. Pt asked if this would be easier on his stomach. I told pt in most patients it does, but to begin on a full day and take as directed with food. Pt states understanding.

## 2019-03-07 ENCOUNTER — Telehealth: Payer: Self-pay | Admitting: *Deleted

## 2019-03-07 DIAGNOSIS — M109 Gout, unspecified: Secondary | ICD-10-CM

## 2019-03-07 MED ORDER — ALLOPURINOL 100 MG PO TABS: 100.0000 mg | ORAL_TABLET | Freq: Every day | ORAL | 0 refills | Status: DC

## 2019-03-07 NOTE — Telephone Encounter (Signed)
Pt states he took all the medrol pack and was fine until this morning and is having the pain in the big toe joint, could he get a refill of the medrol pack. I told pt, it was too early for a refill of that medication, I would inform Dr. Jacqualyn Posey of his current problem. Pt states he is embarrassed, but he thinks he threw the allopurinol away. I told pt I would refill the allopurinol.

## 2019-03-07 NOTE — Telephone Encounter (Signed)
Can you confirm his allopurinol dose? I think he was on 200mg . We should go up on that dose. He should also recheck uric acid level.

## 2019-03-07 NOTE — Telephone Encounter (Signed)
Lets go up to allopurinol 200mg  and recheck uric acid. Can you please order that? Thanks

## 2019-03-07 NOTE — Telephone Encounter (Signed)
I called pt and he states he was to take 200mg  allopurinol but had not for over a week, because he lost the allopurinol.

## 2019-03-08 NOTE — Telephone Encounter (Signed)
I informed pt of Dr. Leigh Aurora orders to change allopurinol to 200mg  daily and to have uric acid repeated.

## 2019-04-10 ENCOUNTER — Telehealth: Payer: Self-pay

## 2019-04-10 NOTE — Telephone Encounter (Signed)
He can come in for a steroid injection if he would like. Also I would like to recheck a uric acid level.

## 2019-04-10 NOTE — Telephone Encounter (Signed)
Please call patient and schedule him with Dr. Jacqualyn Posey

## 2019-04-11 NOTE — Telephone Encounter (Signed)
Called patient and offered an appt for 04/11/19 @ 10:45am. Patient stated he did not want to come in for another steroid injection because it did not help before. Pt stated that the only thing that seemed to help before was a prescription he took for 6 days. Pt requested that we send a prescription in to his pharmacy for that same medication.

## 2019-05-01 ENCOUNTER — Telehealth: Payer: Self-pay

## 2019-05-01 ENCOUNTER — Encounter: Payer: Self-pay | Admitting: Family Medicine

## 2019-05-01 ENCOUNTER — Telehealth (INDEPENDENT_AMBULATORY_CARE_PROVIDER_SITE_OTHER): Payer: Self-pay | Admitting: Family Medicine

## 2019-05-01 ENCOUNTER — Other Ambulatory Visit: Payer: Self-pay | Admitting: General Practice

## 2019-05-01 VITALS — Temp 98.6°F | Ht 70.0 in | Wt 250.0 lb

## 2019-05-01 DIAGNOSIS — R059 Cough, unspecified: Secondary | ICD-10-CM

## 2019-05-01 DIAGNOSIS — Z20828 Contact with and (suspected) exposure to other viral communicable diseases: Secondary | ICD-10-CM

## 2019-05-01 DIAGNOSIS — R05 Cough: Secondary | ICD-10-CM

## 2019-05-01 DIAGNOSIS — Z20822 Contact with and (suspected) exposure to covid-19: Secondary | ICD-10-CM

## 2019-05-01 NOTE — Progress Notes (Signed)
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 His farther is currently in the hospital with Positive COVID Pneumonia

## 2019-05-01 NOTE — Progress Notes (Signed)
Telemedicine Encounter- SOAP NOTE Established Patient  I discussed the limitations, risks, security and privacy concerns of performing an evaluation and management service by telephone and the availability of in person appointments. I also discussed with the patient that there may be a patient responsible charge related to this service. The patient expressed understanding and agreed to proceed.  This telephone encounter was conducted with the patient's  verbal consent via audio telecommunications: yes Patient was instructed to have this encounter in a suitably private space; and to only have persons present to whom they give permission to participate. In addition, patient identity was confirmed by use of name plus two identifiers (DOB and address).  I spent a total of 40min talking with the patient    pts father +COVID-admitted to [facility]-limited contact within the last 2 weeks. Step mother +COVID-exposure -gloves   Roger Jennings is a 43 y.o. male established patient. 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 His farther is currently in the hospital with Positive COVID Pneumonia            Patient Active Problem List   Diagnosis Date Noted  . Decreased ROM of finger 05/11/2017  . Pain in finger of right hand 05/11/2017  . Closed nondisplaced fracture of neck of fifth metacarpal bone of right hand 05/08/2017  . Diverticulitis of colon 12/02/2014  . Rectal bleeding 12/02/2014  . OBSTRUCTIVE SLEEP APNEA 02/18/2010    Past Medical History:  Diagnosis Date  . Allergy   . Diverticulitis   . Kidney stones   . OSA on CPAP   . Sleep apnea     Current Outpatient Medications  Medication Sig Dispense Refill  . allopurinol (ZYLOPRIM) 100 MG tablet Take 1 tablet (100 mg total) by mouth daily. 90 tablet 0  . methylPREDNISolone (MEDROL) 4 MG TBPK tablet  Take as directed. 21 tablet 0   No current facility-administered medications for this visit.     Allergies  Allergen Reactions  . Indomethacin Er Other (See Comments)    Gastrointestinal upset    Social History   Socioeconomic History  . Marital status: Single    Spouse name: Not on file  . Number of children: Not on file  . Years of education: Not on file  . Highest education level: Not on file  Occupational History  . Occupation: employed    Comment: Building surveyor  . Financial resource strain: Not on file  . Food insecurity    Worry: Not on file    Inability: Not on file  . Transportation needs    Medical: Not on file    Non-medical: Not on file  Tobacco Use  . Smoking status: Former Smoker    Types: Cigarettes    Quit date: 10/10/2001    Years since quitting: 17.5  . Smokeless tobacco: Never Used  Substance and Sexual Activity  . Alcohol use: No    Alcohol/week: 0.0 standard drinks    Comment: rare  . Drug use: No  . Sexual activity: Not on file  Lifestyle  . Physical activity    Days per week: Not on file    Minutes per session: Not on file  . Stress: Not on file  Relationships  . Social Herbalist on phone: Not on file    Gets together: Not on  file    Attends religious service: Not on file    Active member of club or organization: Not on file    Attends meetings of clubs or organizations: Not on file    Relationship status: Not on file  . Intimate partner violence    Fear of current or ex partner: Not on file    Emotionally abused: Not on file    Physically abused: Not on file    Forced sexual activity: Not on file  Other Topics Concern  . Not on file  Social History Narrative  . Not on file    Review of Systems  Constitutional: Negative for chills and fever.  HENT: Negative for sore throat.   Respiratory: Negative for cough and shortness of breath.   Gastrointestinal: Negative for diarrhea.  Neurological:  Negative for headaches.    Objective   Vitals as reported by the patient:  1. Exposure to Covid-19 Virus Testing for covid-order placed  I discussed the assessment and treatment plan with the patient. The patient was provided an opportunity to ask questions and all were answered. The patient agreed with the plan and demonstrated an understanding of the instructions.   I provided 10 minutes of non-face-to-face time during this encounter.  LISA Hannah Beat, MD  Primary Care at [facility] @DATE @

## 2019-05-01 NOTE — Telephone Encounter (Signed)
LeighAnn Teyonna Plaisted, CMA  

## 2019-05-02 DIAGNOSIS — Z20828 Contact with and (suspected) exposure to other viral communicable diseases: Secondary | ICD-10-CM | POA: Insufficient documentation

## 2019-05-02 DIAGNOSIS — Z20822 Contact with and (suspected) exposure to covid-19: Secondary | ICD-10-CM | POA: Insufficient documentation

## 2019-05-04 LAB — NOVEL CORONAVIRUS, NAA: SARS-CoV-2, NAA: NOT DETECTED

## 2019-05-07 NOTE — Telephone Encounter (Signed)
COVID negative.

## 2019-05-14 ENCOUNTER — Ambulatory Visit: Payer: Self-pay | Admitting: *Deleted

## 2019-05-14 NOTE — Telephone Encounter (Signed)
Pt reports severe sudden abdominal pain, lower left quad, onset 0300 this am. States h/o diverticulitis. Last episode 4-5 years ago. States this pain feels different. Denies nausea, vomiting.Denies any lower back pain or dysuria. States took MOM to try to have BM, "Small amount was like sand."  Reports last adequate bm yesterday. No blood noted in stool. States pain does not radiate, 7/10. Also reports temp of 102.0 earlier today, checked during call 101.0  No PCP currently  Pt directed to ED, states will follow disposition.   Reason for Disposition . [1] SEVERE pain (e.g., excruciating) AND [2] present > 1 hour  Answer Assessment - Initial Assessment Questions 1. LOCATION: "Where does it hurt?"      Left lower side 2. RADIATION: "Does the pain shoot anywhere else?" (e.g., chest, back)     To middle 3. ONSET: "When did the pain begin?" (Minutes, hours or days ago)  0300     4. SUDDEN: "Gradual or sudden onset?"     sudden 5. PATTERN "Does the pain come and go, or is it constant?"    - If constant: "Is it getting better, staying the same, or worsening?"      (Note: Constant means the pain never goes away completely; most serious pain is constant and it progresses)     - If intermittent: "How long does it last?" "Do you have pain now?"     (Note: Intermittent means the pain goes away completely between bouts)     Intermittent 6. SEVERITY: "How bad is the pain?"  (e.g., Scale 1-10; mild, moderate, or severe)    - MILD (1-3): doesn't interfere with normal activities, abdomen soft and not tender to touch     - MODERATE (4-7): interferes with normal activities or awakens from sleep, tender to touch     - SEVERE (8-10): excruciating pain, doubled over, unable to do any normal activities       7/10 7. RECURRENT SYMPTOM: "Have you ever had this type of abdominal pain before?" If so, ask: "When was the last time?" and "What happened that time?"      Yes, diverticulitis. 8. CAUSE: "What do you  think is causing the abdominal pain?"     Diverticulitis 9. RELIEVING/AGGRAVATING FACTORS: "What makes it better or worse?" (e.g., movement, antacids, bowel movement)     "Sitting on commode even though I can't go."  LBM yesterday 10. OTHER SYMPTOMS: "Has there been any vomiting, diarrhea, constipation, or urine problems?"     no  Protocols used: ABDOMINAL PAIN - MALE-A-AH

## 2019-05-15 ENCOUNTER — Other Ambulatory Visit: Payer: Self-pay

## 2019-05-15 ENCOUNTER — Emergency Department (HOSPITAL_COMMUNITY): Payer: Self-pay

## 2019-05-15 ENCOUNTER — Emergency Department (HOSPITAL_COMMUNITY)
Admission: EM | Admit: 2019-05-15 | Discharge: 2019-05-15 | Disposition: A | Discharge status: Home / Self Care | Payer: Self-pay | Attending: Emergency Medicine | Admitting: Emergency Medicine

## 2019-05-15 ENCOUNTER — Encounter (HOSPITAL_COMMUNITY): Payer: Self-pay

## 2019-05-15 DIAGNOSIS — K529 Noninfective gastroenteritis and colitis, unspecified: Secondary | ICD-10-CM | POA: Insufficient documentation

## 2019-05-15 DIAGNOSIS — Z87891 Personal history of nicotine dependence: Secondary | ICD-10-CM | POA: Insufficient documentation

## 2019-05-15 LAB — COMPREHENSIVE METABOLIC PANEL
ALT: 24 U/L (ref 0–44)
AST: 17 U/L (ref 15–41)
Albumin: 4.4 g/dL (ref 3.5–5.0)
Alkaline Phosphatase: 65 U/L (ref 38–126)
Anion gap: 12 (ref 5–15)
BUN: 12 mg/dL (ref 6–20)
CO2: 24 mmol/L (ref 22–32)
Calcium: 9.6 mg/dL (ref 8.9–10.3)
Chloride: 102 mmol/L (ref 98–111)
Creatinine, Ser: 1.22 mg/dL (ref 0.61–1.24)
GFR calc Af Amer: >60 mL/min (ref >60)
GFR calc non Af Amer: >60 mL/min (ref >60)
Glucose, Bld: 111 mg/dL — ABNORMAL HIGH (ref 70–99)
Potassium: 3.9 mmol/L (ref 3.5–5.1)
Sodium: 138 mmol/L (ref 135–145)
Total Bilirubin: 2.3 mg/dL — ABNORMAL HIGH (ref 0.3–1.2)
Total Protein: 7.8 g/dL (ref 6.5–8.1)

## 2019-05-15 LAB — CBC
HCT: 48.9 % (ref 39.0–52.0)
Hemoglobin: 16.3 g/dL (ref 13.0–17.0)
MCH: 30.4 pg (ref 26.0–34.0)
MCHC: 33.3 g/dL (ref 30.0–36.0)
MCV: 91.1 fL (ref 80.0–100.0)
Platelets: 160 10*3/uL (ref 150–400)
RBC: 5.37 MIL/uL (ref 4.22–5.81)
RDW: 13.2 % (ref 11.5–15.5)
WBC: 12.3 10*3/uL — ABNORMAL HIGH (ref 4.0–10.5)
nRBC: 0 % (ref 0.0–0.2)

## 2019-05-15 LAB — URINALYSIS, ROUTINE W REFLEX MICROSCOPIC
Bacteria, UA: NONE SEEN
Bilirubin Urine: NEGATIVE
Glucose, UA: NEGATIVE mg/dL
Ketones, ur: NEGATIVE mg/dL
Leukocytes,Ua: NEGATIVE
Nitrite: NEGATIVE
Protein, ur: 30 mg/dL — AB
Specific Gravity, Urine: 1.026 (ref 1.005–1.030)
pH: 5 (ref 5.0–8.0)

## 2019-05-15 LAB — LIPASE, BLOOD: Lipase: 25 U/L (ref 11–51)

## 2019-05-15 MED ORDER — IOHEXOL 300 MG/ML  SOLN
100.0000 mL | Freq: Once | INTRAMUSCULAR | Status: AC | PRN
  Administered 2019-05-15: 15:00:00 100 mL via INTRAVENOUS

## 2019-05-15 MED ORDER — METRONIDAZOLE 500 MG PO TABS: 500.0000 mg | ORAL_TABLET | Freq: Three times a day (TID) | ORAL | 0 refills | Status: DC

## 2019-05-15 MED ORDER — SODIUM CHLORIDE 0.9 % IV BOLUS
1000.0000 mL | Freq: Once | INTRAVENOUS | Status: AC
  Administered 2019-05-15: 1000 mL via INTRAVENOUS

## 2019-05-15 MED ORDER — SODIUM CHLORIDE 0.9% FLUSH: 3.0000 mL | Freq: Once | INTRAVENOUS | Status: DC

## 2019-05-15 MED ORDER — CIPROFLOXACIN HCL 500 MG PO TABS: 500.0000 mg | ORAL_TABLET | Freq: Two times a day (BID) | ORAL | 0 refills | Status: DC

## 2019-05-15 NOTE — ED Provider Notes (Signed)
[facility] [place] EMERGENCY DEPARTMENT Provider Note   CSN: [id number] Arrival date & time: 05/15/19  1100     History   Chief Complaint Chief Complaint  Patient presents with  . Abdominal Pain    HPI Roger Jennings is a 43 y.o. male who presents with abdominal pain.  Past medical history significant for diverticulitis, kidney stones.  Patient states that yesterday morning he woke up with some left-sided abdominal pain.  Pain sometimes would radiate across his lower abdomen.  He reports associated fever with a T-max of 102 at home.  He also has had some difficulty having a bowel movement and states that when he does it looks like sand.  Similar symptoms are somewhat similar to when he has had a diverticulitis in the past.  He has had a colonoscopy in 2016 which showed diverticulosis and internal hemorrhoids.  He has not had much of an appetite.  It hurts to walk. He denies chest pain, shortness of breath, cough, nausea, vomiting, urinary symptoms.  He is also had a kidney stone before but does not feel similar.  He denies any prior abdominal surgeries. Pain continued this morning and therefore he decided to come get it checked out.     HPI  Past Medical History:  Diagnosis Date  . Allergy   . Diverticulitis   . Kidney stones   . OSA on CPAP   . Sleep apnea     Patient Active Problem List   Diagnosis Date Noted  . Exposure to Covid-19 Virus 05/02/2019  . Decreased ROM of finger 05/11/2017  . Pain in finger of right hand 05/11/2017  . Closed nondisplaced fracture of neck of fifth metacarpal bone of right hand 05/08/2017  . Diverticulitis of colon 12/02/2014  . Rectal bleeding 12/02/2014  . OBSTRUCTIVE SLEEP APNEA 02/18/2010    Past Surgical History:  Procedure Laterality Date  . NO PAST SURGERIES          Home Medications    Prior to Admission medications   Medication Sig Start Date End Date Taking? Authorizing Provider  allopurinol (ZYLOPRIM) 100 MG  tablet Take 1 tablet (100 mg total) by mouth daily. 03/07/19   Trula Slade, DPM  methylPREDNISolone (MEDROL) 4 MG TBPK tablet Take as directed. 02/27/19   Trula Slade, DPM    Family History Family History  Problem Relation Age of Onset  . Diabetes Mellitus II Mother   . Hypertension Father   . Coronary artery disease Paternal Grandmother   . Colon cancer Neg Hx   . Esophageal cancer Neg Hx   . Gallbladder disease Neg Hx   . Rectal cancer Neg Hx   . Stomach cancer Neg Hx     Social History Social History   Tobacco Use  . Smoking status: Former Smoker    Types: Cigarettes    Quit date: 10/10/2001    Years since quitting: 17.6  . Smokeless tobacco: Never Used  Substance Use Topics  . Alcohol use: No    Alcohol/week: 0.0 standard drinks    Comment: rare  . Drug use: No     Allergies   Indomethacin er   Review of Systems Review of Systems  Constitutional: Positive for appetite change and fever. Negative for chills.  Respiratory: Negative for cough and shortness of breath.   Cardiovascular: Negative for chest pain.  Gastrointestinal: Positive for abdominal pain and constipation. Negative for blood in stool, diarrhea, nausea and vomiting.  Genitourinary: Positive for flank pain.  Negative for difficulty urinating, dysuria and hematuria.  All other systems reviewed and are negative.    Physical Exam Updated Vital Signs BP 130/89 (BP Location: Right Arm)   Pulse 94   Temp 98.6 F (37 C) (Oral)   Resp 20   SpO2 98%   Physical Exam Vitals signs and nursing note reviewed.  Constitutional:      General: He is not in acute distress.    Appearance: He is well-developed. He is obese. He is not ill-appearing.  HENT:     Head: Normocephalic and atraumatic.  Eyes:     General: No scleral icterus.       Right eye: No discharge.        Left eye: No discharge.     Conjunctiva/sclera: Conjunctivae normal.     Pupils: Pupils are equal, round, and reactive to  light.  Neck:     Musculoskeletal: Normal range of motion.  Cardiovascular:     Rate and Rhythm: Normal rate.  Pulmonary:     Effort: Pulmonary effort is normal. No respiratory distress.  Abdominal:     General: Abdomen is protuberant. Bowel sounds are normal. There is no distension.     Palpations: Abdomen is soft.     Tenderness: There is abdominal tenderness in the left lower quadrant. There is left CVA tenderness. There is no guarding or rebound.     Hernia: No hernia is present.  Skin:    General: Skin is warm and dry.  Neurological:     Mental Status: He is alert and oriented to person, place, and time.  Psychiatric:        Behavior: Behavior normal.      ED Treatments / Results  Labs (all labs ordered are listed, but only abnormal results are displayed) Labs Reviewed  COMPREHENSIVE METABOLIC PANEL - Abnormal; Notable for the following components:      Result Value   Glucose, Bld 111 (*)    Total Bilirubin 2.3 (*)    All other components within normal limits  CBC - Abnormal; Notable for the following components:   WBC 12.3 (*)    All other components within normal limits  URINALYSIS, ROUTINE W REFLEX MICROSCOPIC - Abnormal; Notable for the following components:   Color, Urine AMBER (*)    Hgb urine dipstick SMALL (*)    Protein, ur 30 (*)    All other components within normal limits  LIPASE, BLOOD    EKG None  Radiology Ct Abdomen Pelvis W Contrast  Result Date: 05/15/2019 CLINICAL DATA:  Abdominal pain, diverticulitis EXAM: CT ABDOMEN AND PELVIS WITH CONTRAST TECHNIQUE: Multidetector CT imaging of the abdomen and pelvis was performed using the standard protocol following bolus administration of intravenous contrast. CONTRAST:  117mL OMNIPAQUE IOHEXOL 300 MG/ML  SOLN COMPARISON:  09/03/2014 FINDINGS: Lower chest: Bibasilar bandlike scarring or atelectasis. Hepatobiliary: No solid liver abnormality is seen. No gallstones, gallbladder wall thickening, or biliary  dilatation. Pancreas: Unremarkable. No pancreatic ductal dilatation or surrounding inflammatory changes. Spleen: Normal in size without significant abnormality. Adrenals/Urinary Tract: Adrenal glands are unremarkable. Kidneys are normal, without renal calculi, solid lesion, or hydronephrosis. Bladder is unremarkable. Stomach/Bowel: Stomach is within normal limits. Appendix appears normal. There is occasional descending and sigmoid diverticulosis with wall thickening and adjacent fat stranding of the mid descending colon (series 3, image 47, series 6, image 75). Vascular/Lymphatic: No significant vascular findings are present. No enlarged abdominal or pelvic lymph nodes. Reproductive: No mass or other significant abnormality. Other: No  abdominal wall hernia or abnormality. Trace inflammatory fluid in the left paracolic gutter. Musculoskeletal: No acute or significant osseous findings. IMPRESSION: There is occasional descending and sigmoid diverticulosis with wall thickening and adjacent fat stranding of the mid descending colon (series 3, image 47, series 6, image 75). There is minimal if any diverticular disease in this particular segment and findings are consistent with nonspecific infectious, inflammatory, or ischemic colitis. Underlying malignancy is not strictly excluded. Consider evaluation by colonoscopy at the resolution of acute clinical symptoms. Electronically Signed   By: Eddie Candle M.D.   On: 05/15/2019 15:31    Procedures Procedures (including critical care time)  Medications Ordered in ED Medications  sodium chloride flush (NS) 0.9 % injection 3 mL (has no administration in time range)  sodium chloride 0.9 % bolus 1,000 mL (0 mLs Intravenous Stopped 05/15/19 1539)  iohexol (OMNIPAQUE) 300 MG/ML solution 100 mL (100 mLs Intravenous Contrast Given 05/15/19 1520)     Initial Impression / Assessment and Plan / ED Course  I have reviewed the triage vital signs and the nursing notes.   Pertinent labs & imaging results that were available during my care of the patient were reviewed by me and considered in my medical decision making (see chart for details).  43 year old male presents with left-sided abdominal pain since yesterday.  Feels somewhat similar to when he had diverticulitis in the past.  His vital signs are normal.  He has some left flank tenderness but no left lower quadrant or suprapubic tenderness.  Will obtain labs, UA, CT abdomen pelvis to further evaluate.  CBC is remarkable for mild leukocytosis of 12.  CMP is overall normal.  UA has a small amount of hemoglobin but no signs of infection.  CT shows wall thickening and fat stranding around the mid descending colon without evidence of diverticulitis.  I discussed with the patient.  He states that he does not currently have a PCP.  Will prescribe him Cipro and Flagyl and have him follow-up with [facility] who is seen in the past.  Final Clinical Impressions(s) / ED Diagnoses   Final diagnoses:  Colitis    ED Discharge Orders    None       Recardo Evangelist, PA-C 05/15/19 [place], [place], DO 05/15/19 1702

## 2019-05-15 NOTE — ED Triage Notes (Signed)
Onset yesterday LLQ abd pain, sharp at times.  Pt thinks may be diverticulitis.  No N/V/D.  No urinary symptoms.  Last normal BM 2 days ago, at 9:30a had small, soft, grainy stool.  Pt took MOM this morning.

## 2019-05-15 NOTE — Discharge Instructions (Signed)
Take flagyl 500mg  three times a day for one week Take Cipro 500mg  twice a day for one week Please eat soft food until symptoms improve Follow up with GI

## 2019-05-16 ENCOUNTER — Encounter: Payer: Self-pay | Admitting: Gastroenterology

## 2019-05-17 ENCOUNTER — Other Ambulatory Visit: Payer: Self-pay

## 2019-05-17 ENCOUNTER — Ambulatory Visit: Payer: Self-pay | Admitting: *Deleted

## 2019-05-17 DIAGNOSIS — Z20822 Contact with and (suspected) exposure to covid-19: Secondary | ICD-10-CM

## 2019-05-17 NOTE — Telephone Encounter (Signed)
I was in ED Wednesday for lower GI infection.        "My voice is changing"   " No problems breathing but when I breath I feel weird".  What's going on with me? "No shortness  of breath but feels weird".   My brother tested positive for COVID but I have not been around him except for maybe 2 minutes 2 weeks ago.   Denies fever,coughing sore throat, nasal congestion.   "Just my voice is changing"     I tested negative for COVID-19 2 wks ago.     There was not a protocol to follow.   I advised him to go to an urgent care and told him about [facility] so they could look at his throat with a light.  I instructed him to go to the ED if he developed chest tightness or shortness of breath both of which he denied when I asked him about it.     I also let him know he could go have another COVID-19 test if he felt like he needed to.   No appt or dr order needed.  He was familiar with the test site on [address] that [facility] does.    He thanked me for my help.

## 2019-05-18 LAB — NOVEL CORONAVIRUS, NAA: SARS-CoV-2, NAA: NOT DETECTED

## 2019-05-21 ENCOUNTER — Telehealth: Payer: Self-pay | Admitting: *Deleted

## 2019-05-21 DIAGNOSIS — M109 Gout, unspecified: Secondary | ICD-10-CM

## 2019-05-21 NOTE — Telephone Encounter (Signed)
Pt states he is having another flare of gout, but in the left foot this time, it seems to occur every 3-4 weeks, and he is taking 2 pills a day and has changed his diet.

## 2019-05-22 NOTE — Telephone Encounter (Signed)
Faxed orders to [facility] [facility].

## 2019-05-22 NOTE — Telephone Encounter (Signed)
I informed pt of Dr. Leigh Aurora orders. Pt states understanding, but was seen last week for colitis and wanted to know if the medrol dose pack would be okay to take with the medication prescribed 05/15/2019.

## 2019-05-22 NOTE — Telephone Encounter (Signed)
I think at that time we need to refer him to rheumatology. Also did he ever get a PCP?   Could do a medrol dose pack for now.

## 2019-05-23 MED ORDER — METHYLPREDNISOLONE 4 MG PO TBPK: ORAL_TABLET | ORAL | 0 refills | Status: DC

## 2019-05-23 NOTE — Telephone Encounter (Signed)
It should be OK- how much longer does he have on the meds?

## 2019-05-23 NOTE — Telephone Encounter (Signed)
I informed pt of Dr. Leigh Aurora statement and asked pt if he had completed the medications. Pt states he has "slowed" down on the medications, because they were causing him to be irritable.

## 2019-05-23 NOTE — Telephone Encounter (Signed)
Then I probably would not start the medrol dose pack. I would not recommend NSAIDs. We can do an injection but he didn't want to last appointment.

## 2019-05-28 NOTE — Telephone Encounter (Signed)
Faxed referral, clinicals and demographics to [facility].

## 2019-05-29 ENCOUNTER — Telehealth: Payer: Self-pay | Admitting: Podiatry

## 2019-05-29 DIAGNOSIS — M109 Gout, unspecified: Secondary | ICD-10-CM

## 2019-05-29 NOTE — Addendum Note (Signed)
Addended by: Harriett Sine D on: 05/29/2019 11:25 AM   Modules accepted: Orders

## 2019-05-29 NOTE — Telephone Encounter (Signed)
[facility] called to let us know they had received the referral for the patient but are unable to schedule him because they do not accept medicaid.

## 2019-05-29 NOTE — Telephone Encounter (Signed)
Left message informing pt [facility] sent a denial of the referral due to they do not accept Medicaid. Our office does not have Medicaid as his insurance and to please send [place] copy of his card. I told pt I would be send Rheumatology referral to a doctor in [facility] that accepts [facility] [place] [facility] [phone].

## 2019-06-12 ENCOUNTER — Telehealth: Payer: Self-pay | Admitting: *Deleted

## 2019-06-12 DIAGNOSIS — M109 Gout, unspecified: Secondary | ICD-10-CM

## 2019-06-12 NOTE — Telephone Encounter (Signed)
Pt states he has 2 of the allopurinol pills left and would like a refill.

## 2019-06-12 NOTE — Telephone Encounter (Signed)
Ok to refill. Hopefully we can get him into a rheumatologist. Also, can you see if he ever got a PCP. If we cannot get him into a rhum then we can get him a PCP.

## 2019-06-13 ENCOUNTER — Encounter: Payer: Self-pay | Admitting: Gastroenterology

## 2019-06-13 ENCOUNTER — Ambulatory Visit (INDEPENDENT_AMBULATORY_CARE_PROVIDER_SITE_OTHER): Payer: Self-pay | Admitting: Gastroenterology

## 2019-06-13 VITALS — BP 100/68 | HR 84 | Temp 98.7°F | Ht 70.0 in | Wt 236.0 lb

## 2019-06-13 DIAGNOSIS — Z8719 Personal history of other diseases of the digestive system: Secondary | ICD-10-CM

## 2019-06-13 DIAGNOSIS — Z598 Other problems related to housing and economic circumstances: Secondary | ICD-10-CM

## 2019-06-13 DIAGNOSIS — Z5971 Insufficient health insurance coverage: Secondary | ICD-10-CM

## 2019-06-13 DIAGNOSIS — R935 Abnormal findings on diagnostic imaging of other abdominal regions, including retroperitoneum: Secondary | ICD-10-CM

## 2019-06-13 DIAGNOSIS — Z5989 Other problems related to housing and economic circumstances: Secondary | ICD-10-CM

## 2019-06-13 MED ORDER — ALLOPURINOL 100 MG PO TABS: ORAL_TABLET | ORAL | 6 refills | Status: DC

## 2019-06-13 MED ORDER — ALLOPURINOL 100 MG PO TABS: 100.0000 mg | ORAL_TABLET | Freq: Every day | ORAL | 0 refills | Status: DC

## 2019-06-13 NOTE — Progress Notes (Signed)
[facility] VISIT   Primary Care Provider Patient, No Pcp Per No address on file None  Referring Provider No referring provider defined for this encounter.   Patient Profile: Roger Jennings is a 43 y.o. male with a pmh significant for diverticulosis and prior diverticulitis, nephrolithiasis, sleep apnea, allergies, anxiety, gout.  The patient presents to the [facility] for an evaluation and management of problem(s) noted below:  Problem List 1. Abnormal CT of the abdomen   2. Hx of diverticulitis of colon   3. History of colitis   4. Under or uninsured     History of Present Illness This is the patient's first visit in 4 years to the outpatient [facility].  He had previously been evaluated years ago after having 2 bouts of diverticulitis in the same year.  Colonoscopy was performed with results as below suggesting diverticulosis in the left colon.  Patient has done well over the course of the last 4 years.  However acutely he weeks ago he began to have increased abdominal discomfort and a general sense of feeling unwell.  As things progressed and he started to have abdominal discomfort in the left region of his abdomen he went into the ED for further evaluation.  He underwent cross-sectional imaging as well as had laboratories performed and was found to have a leukocytosis and normal kidney function and electrolytes.  However on his CT scan there was evidence of colitis in the descending colon with what was remarked to be some diverticula in the region but not overtly concerning for diverticulitis.  He is prior diverticulitis bouts were in the sigmoid colon as evidenced on 2 prior CTs from 2016.  The patient was prescribed antibiotics and was discharged home.  After 2 days he was feeling back to normal.  He normally has a bowel movement 1-2 times per day.  They are formed and not hard.  He has history of gout and has had to use pain  medications and nonsteroidals infrequently.  He is currently on allopurinol but now is having gout symptoms on his right foot but previously had them on the left.  He has some thumb pain.  He has canker sores occur once yearly in his mouth as well as some fever blisters that occur when he is ill.  He is currently uninsured and is barely able to make payments on the significant bill that he obtained from his emergency department visit.  He is concerned about what was discussed as possible masslike changes but is not sure whether he can afford a procedure at this time.  GI Review of Systems Positive as above Negative for pyrosis, dysphagia, odynophagia, abdominal pain, nausea, vomiting, melena, hematochezia  Review of Systems General: Denies fevers/chills/weight loss HEENT: Denies oral lesions Cardiovascular: Denies chest pain/palpitations Pulmonary: Denies shortness of breath/cough Gastroenterological: See HPI Genitourinary: Denies darkened urine Hematological: Denies easy bruising/bleeding Endocrine: Denies temperature intolerance Dermatological: Denies jaundice Psychological: Mood is stable though he worries about the costs of his medications and his health as a result of being uninsured   Medications Current Outpatient Medications  Medication Sig Dispense Refill   allopurinol (ZYLOPRIM) 100 MG tablet Take 2 tablets daily. 180 tablet 6   No current facility-administered medications for this visit.     Allergies Allergies  Allergen Reactions   Indomethacin Er Other (See Comments)    Gastrointestinal upset    Histories Past Medical History:  Diagnosis Date   Allergy    Anxiety  Diverticulitis    Kidney stones    OSA on CPAP    Sleep apnea    Past Surgical History:  Procedure Laterality Date   NO PAST SURGERIES     Social History   Socioeconomic History   Marital status: Single    Spouse name: Not on file   Number of children: Not on file   Years of  education: Not on file   Highest education level: Not on file  Occupational History   Occupation: employed    Comment: Scientist, clinical (histocompatibility and immunogenetics) strain: Not on file   Food insecurity    Worry: Not on file    Inability: Not on file   Transportation needs    Medical: Not on file    Non-medical: Not on file  Tobacco Use   Smoking status: Former Smoker    Types: Cigarettes    Quit date: 10/10/2001    Years since quitting: 17.6   Smokeless tobacco: Never Used  Substance and Sexual Activity   Alcohol use: No    Alcohol/week: 0.0 standard drinks    Comment: rare   Drug use: No   Sexual activity: Not on file  Lifestyle   Physical activity    Days per week: Not on file    Minutes per session: Not on file   Stress: Not on file  Relationships   Social connections    Talks on phone: Not on file    Gets together: Not on file    Attends religious service: Not on file    Active member of club or organization: Not on file    Attends meetings of clubs or organizations: Not on file    Relationship status: Not on file   Intimate partner violence    Fear of current or ex partner: Not on file    Emotionally abused: Not on file    Physically abused: Not on file    Forced sexual activity: Not on file  Other Topics Concern   Not on file  Social History Narrative   Not on file   Family History  Problem Relation Age of Onset   Diabetes Mellitus II Mother    Hypertension Father    Colon polyps Father    Coronary artery disease Paternal Grandmother    Colon cancer Neg Hx    Esophageal cancer Neg Hx    Gallbladder disease Neg Hx    Rectal cancer Neg Hx    Stomach cancer Neg Hx    Inflammatory bowel disease Neg Hx    Liver disease Neg Hx    Pancreatic cancer Neg Hx    I have reviewed his medical, social, and family history in detail and updated the electronic medical record as necessary.    PHYSICAL EXAMINATION  BP 100/68     Pulse 84    Temp 98.7 F (37.1 C)    Ht 5\' 10"  (1.778 m)    Wt 236 lb (107 kg)    BMI 33.86 kg/m  Wt Readings from Last 3 Encounters:  06/13/19 236 lb (107 kg)  05/01/19 250 lb (113.4 kg)  05/31/17 235 lb 3.2 oz (106.7 kg)  GEN: NAD, appears stated age, doesn't appear chronically ill PSYCH: Cooperative, without pressured speech EYE: Conjunctivae pink, sclerae anicteric ENT: MMM, without oral ulcers, no erythema or exudates noted NECK: Supple CV: RR without R/Gs  RESP: CTAB posteriorly, without wheezing GI: NABS, soft, NT/ND, without rebound or guarding, no HSM appreciated MSK/EXT:  No lower extremity edema SKIN: No jaundice NEURO:  Alert & Oriented x 3, no focal deficits   REVIEW OF DATA  I reviewed the following data at the time of this encounter:  GI Procedures and Studies  March 2016 colonoscopy ENDOSCOPIC IMPRESSION: 1. There was mild diverticulosis noted in the descending colon and sigmoid colon with stigmata of recent diverticulitis 2. Internal hemorrhoids  Laboratory Studies  Reviewed those in epic  Imaging Studies  July 2015 CT abdomen pelvis with contrast IMPRESSION: Focal diverticulitis of the distal sigmoid colon.  November 2015 CT abdomen pelvis with contrast IMPRESSION: Mid to distal sigmoid diverticulitis without frank abscess. No bowel obstruction. Appendix appears normal. No renal or ureteral calculus. No hydronephrosis.  August 2020 CT abdomen pelvis with contrast IMPRESSION: There is occasional descending and sigmoid diverticulosis with wall thickening and adjacent fat stranding of the mid descending colon (series 3, image 47, series 6, image 75). There is minimal if any diverticular disease in this particular segment and findings are consistent with nonspecific infectious, inflammatory, or ischemic colitis. Underlying malignancy is not strictly excluded. Consider evaluation by colonoscopy at the resolution of acute  clinical Symptoms.   ASSESSMENT  Mr. Brusso is a 43 y.o. male with a pmh significant for diverticulosis and prior diverticulitis, nephrolithiasis, sleep apnea, allergies, anxiety, gout.  The patient is seen today for evaluation and management of:  1. Abnormal CT of the abdomen   2. Hx of diverticulitis of colon   3. History of colitis   4. Under or uninsured    The patient is clinically and hemodynamically stable at this time.  He has a history of 2 prior episodes of diverticulitis years ago that were followed up with a colonoscopy at that time.  That was unremarkable for any colonic lesions other than diverticulosis which was noted to be in the descending as well as the sigmoid and rectosigmoid region.  Prior episodes of diverticulitis were in the sigmoid region as evidenced on 2 prior CT scans.  I suspect that this patient did have diverticulitis based on his symptoms and having prior history of having diverticula in the region however I appreciate our radiologist concerned about whether this could have been a separate underlying colitis.  In their notation there is concern whether there could be underlying masslike pathology that may have been missed.  As such I recommendation would normally be for endoscopic evaluation of the region to ensure that there is nothing else that is being missed.  At a minimum repeat cross-sectional imaging could be considered.  However as a result of the patient being uninsured at this time he is concerned about his ability to even proceed with any further follow-up or management.  He is not sure if he is going to be able to get any more chances of getting insurance based on how the job market is in his current situation.  I will have our financial group reach out to the patient to let him know what a colonoscopy or sigmoidoscopy may entail in terms of cost and how there could be payment plans that could be accessible for the patient.  Cross-sectional imaging may be  something to consider as well but will also be and incur the expense.  I think it would be reasonable to try and reevaluate the region in approximately 2 to 3 months which will allow healing of things and so that we have a good sense of what things will look like at that time.  We will  schedule a follow-up in clinic for [place] to discuss how he would like to move forward with things.  He understands my concern that there could be an underlying mass or lesion even though he has had a high-quality colonoscopy within the last 5 years but the way the radiology reading is which suggest the need for further follow-up.  I will relay to my staff to see if we can have the patient be evaluated at the wellness and community program to see if he may qualify in the future for orange card status.  All patient questions were answered, to the best of my ability, and the patient agrees to the aforementioned plan of action with follow-up as indicated.   PLAN  Follow-up in 2 to 3 months in clinic to discuss if he would like to move forward with colonoscopy/sigmoidoscopy to evaluate the region and ensure no further evidence of inflammation or mass/lesion is present- could consider CT scan of the region however endoscopic views would be preferential Unclear what patient wants to do currently due to a financial stress/burden Will ask staff to see if he may be referred to community health and wellness and see if he may benefit from orange card status   No orders of the defined types were placed in this encounter.   New Prescriptions   No medications on file   Modified Medications   No medications on file    Planned Follow Up No follow-ups on file.   Justice Britain, MD [facility] # [phone]

## 2019-06-13 NOTE — Patient Instructions (Signed)
A high fiber diet with plenty of fluids (up to 8 glasses of water daily) is suggested to relieve these symptoms.  Metamucil, 1 tablespoon once or twice daily can be used to keep bowels regular if needed.  Start fiber choice- once daily.   In 2-3 months I would recommend we perform a CT scan abd/pelvis or proceed with Colonoscopy to check on the abnormal area of large intestine.   Please contact Patient Services at [phone] for Financial Assistance . Please see form given today.      Thank you for choosing me and [facility].  Dr. Rush Landmark

## 2019-06-13 NOTE — Telephone Encounter (Signed)
I informed pt of Dr. Leigh Aurora refill of the Allopurinol and asked if he had gotten an appt with the rheumatologist. Pt states he has been dealing with a $3000.00 bill from [facility] and once that was taken care of he would call the rheumatologist. I asked pt if he had a PCP and he stated he needed one. I told pt I would begin the process of the referral with [facility].

## 2019-06-14 ENCOUNTER — Telehealth: Payer: Self-pay

## 2019-06-14 DIAGNOSIS — Z8719 Personal history of other diseases of the digestive system: Secondary | ICD-10-CM | POA: Insufficient documentation

## 2019-06-14 DIAGNOSIS — Z598 Other problems related to housing and economic circumstances: Secondary | ICD-10-CM | POA: Insufficient documentation

## 2019-06-14 DIAGNOSIS — Z5989 Other problems related to housing and economic circumstances: Secondary | ICD-10-CM | POA: Insufficient documentation

## 2019-06-14 DIAGNOSIS — R935 Abnormal findings on diagnostic imaging of other abdominal regions, including retroperitoneum: Secondary | ICD-10-CM | POA: Insufficient documentation

## 2019-06-14 HISTORY — DX: Personal history of other diseases of the digestive system: Z87.19

## 2019-06-14 NOTE — Telephone Encounter (Signed)
Left message on machine to call back  

## 2019-06-14 NOTE — Telephone Encounter (Signed)
-----   Message from Irving Copas., MD sent at 06/14/2019  1:08 PM EDT ----- Regarding: Follow-up Roger Jennings,This patient is uninsured.After talking with Glendell Docker, we thought it may be reasonable so the patient could be referred to community health and wellness for primary care provider evaluation and potentially may be able to be evaluated for an orange card status.Glendell Docker told me that [name] you have done this for patients in the past if you can recheck the patient and let them know that we would like to refer him there and maybe he would follow-up and that can be helpful for his financial burden being uninsured.Thanks.GM

## 2019-06-18 NOTE — Telephone Encounter (Signed)
I have tried to call several times to get an appt for the pt.  No answer and no voice mail.  On hold for 20 min and 15 min.  I will send a message to the pt that I have tried to set up the pt without luck.  Will try at a later day.

## 2019-06-25 NOTE — Telephone Encounter (Signed)
I received a call from the pt stating that he was referred to [facility] Wellness by [employer].  He also states he has some very slight lower abd discomfort that comes and goes.  He says he has a fever of 102.8 F yesterday but today is 98.9 F as of an hour ago.  He says he has no nausea, or rectal bleeding.  Please advise

## 2019-06-25 NOTE — Telephone Encounter (Signed)
[name], I am glad that he is doing better.  If the issues are persisting we may consider giving him a round of antibiotics. Hopefully we do not need to but if you hear back from him before the end of the week and let us plan on having him do that to hopefully prevent him from needing to come into the hospital or ED. Thanks. GM

## 2019-06-26 ENCOUNTER — Other Ambulatory Visit: Payer: Self-pay

## 2019-06-26 MED ORDER — AMOXICILLIN-POT CLAVULANATE 875-125 MG PO TABS: 1.0000 | ORAL_TABLET | Freq: Two times a day (BID) | ORAL | 0 refills | Status: AC

## 2019-06-26 NOTE — Telephone Encounter (Signed)
Call placed to the pt and advised him of the plan.  He will pick up abx and call back on Friday with an update.  He is aware that if his symptoms progress he is to go to the Ed for evaluation.

## 2019-06-26 NOTE — Telephone Encounter (Signed)
Dr Rush Landmark the pt states he just has a feeling of unwellness.  He says he has no appetite and the lower abd discomfort is concerning.  Please advise.

## 2019-06-26 NOTE — Telephone Encounter (Signed)
[name], Go ahead and place the patient on Augmentin 875 mg BID x 10-days. Let him know if things progress while he is on antibiotics then he needs to come to the ED for further evaluation. Please call on Friday, if the patient is still having issues but stable, may see about getting a CT-Abdomen performed to ensure no further complicating issues are present. Thank you. GM

## 2019-06-28 ENCOUNTER — Telehealth: Payer: Self-pay | Admitting: Gastroenterology

## 2019-06-28 NOTE — Telephone Encounter (Signed)
Pt advised that it should not interact with each other, but he could call pharmacist to make sure if he has concerns.

## 2019-08-07 ENCOUNTER — Telehealth: Payer: Self-pay | Admitting: Gastroenterology

## 2019-08-07 MED ORDER — CIPROFLOXACIN HCL 500 MG PO TABS: 500.0000 mg | ORAL_TABLET | Freq: Two times a day (BID) | ORAL | 0 refills | Status: DC

## 2019-08-07 MED ORDER — METRONIDAZOLE 500 MG PO TABS: 500.0000 mg | ORAL_TABLET | Freq: Two times a day (BID) | ORAL | 0 refills | Status: AC

## 2019-08-07 NOTE — Telephone Encounter (Signed)
Prescription sent and pt aware to call with an update.

## 2019-08-07 NOTE — Telephone Encounter (Signed)
Please send the patient Ciprofloxacin 500 mg BID x 10-days and Flagyl 500 mg BID x 10-days. If he worsens, he needs a repeat CTAP with IV/PO contrast. Thank you for update and keep me UTD as to how he is doing. GM

## 2019-08-07 NOTE — Telephone Encounter (Signed)
The pt complains of lower abd pain and  100.3 temp yesterday.  99.2 today.  Has chills.  NO diarrhea or constipation. No nausea or vomiting.  The pt wants to know if he needs another course of abx?

## 2019-08-08 ENCOUNTER — Other Ambulatory Visit: Payer: Self-pay

## 2019-08-08 MED ORDER — CIPROFLOXACIN HCL 500 MG PO TABS: 500.0000 mg | ORAL_TABLET | Freq: Two times a day (BID) | ORAL | 0 refills | Status: DC

## 2019-08-08 MED ORDER — CIPROFLOXACIN HCL 500 MG PO TABS: 500.0000 mg | ORAL_TABLET | Freq: Two times a day (BID) | ORAL | 0 refills | Status: AC

## 2019-09-02 ENCOUNTER — Ambulatory Visit: Payer: Self-pay

## 2019-11-08 ENCOUNTER — Telehealth: Payer: Self-pay

## 2019-11-08 NOTE — Telephone Encounter (Signed)
Called patient to do their pre-visit COVID screening.  Patient states that he would like to cancel appointment. Will call back to reschedule.

## 2019-11-11 ENCOUNTER — Ambulatory Visit: Payer: Self-pay | Admitting: Internal Medicine

## 2020-08-30 ENCOUNTER — Other Ambulatory Visit: Payer: Self-pay | Admitting: Podiatry

## 2020-08-31 NOTE — Telephone Encounter (Signed)
Please advise 

## 2020-09-02 ENCOUNTER — Telehealth: Payer: Self-pay | Admitting: *Deleted

## 2020-09-02 NOTE — Telephone Encounter (Signed)
Patient has questions about the refill of Allopurinol 100mg , [facility] texed and said that they are unable to refill. Please call them to address.

## 2020-09-09 ENCOUNTER — Other Ambulatory Visit: Payer: Self-pay

## 2020-09-09 ENCOUNTER — Telehealth: Payer: Self-pay

## 2020-09-09 MED ORDER — ALLOPURINOL 100 MG PO TABS: ORAL_TABLET | ORAL | 0 refills | Status: DC

## 2020-09-09 NOTE — Telephone Encounter (Signed)
Thanks. Yes, he will need to come in for a refill and get blood work

## 2020-09-09 NOTE — Telephone Encounter (Signed)
Patient called about a refill of allopurinol. I spoke to patient and advised that we could send in 1 refill but then he would need an appointment prior to any further refills. He voiced understanding and will call back to make an appointment. I will send refill to [facility].

## 2020-11-05 ENCOUNTER — Telehealth: Payer: Self-pay | Admitting: *Deleted

## 2020-11-05 NOTE — Telephone Encounter (Signed)
Patient is calling to schedule appointment w/ Dr Jacqualyn Posey. He would like to make the appointment at the [facility] if he still goes there, if not will schedule at [facility] office. Please call.

## 2020-11-16 ENCOUNTER — Telehealth: Payer: Self-pay | Admitting: Podiatry

## 2020-11-16 NOTE — Telephone Encounter (Signed)
Pt would like to know when's the next time you're going to the community health and wellness center. Please advise.

## 2020-11-17 NOTE — Telephone Encounter (Signed)
I should be there the first Monday in March. If he cannot wait to come in let me know and I will see him here (no charge)

## 2020-11-27 ENCOUNTER — Other Ambulatory Visit: Payer: Self-pay | Admitting: Podiatry

## 2020-11-30 ENCOUNTER — Ambulatory Visit (INDEPENDENT_AMBULATORY_CARE_PROVIDER_SITE_OTHER): Payer: Self-pay | Admitting: Podiatry

## 2020-11-30 ENCOUNTER — Ambulatory Visit (INDEPENDENT_AMBULATORY_CARE_PROVIDER_SITE_OTHER): Payer: Self-pay

## 2020-11-30 ENCOUNTER — Other Ambulatory Visit: Payer: Self-pay

## 2020-11-30 DIAGNOSIS — M109 Gout, unspecified: Secondary | ICD-10-CM

## 2020-12-05 NOTE — Progress Notes (Signed)
Subjective: 45 year old male presents the office today for follow-up evaluation of gout.  He states that since we increased allopurinol 200 mg he has done well not had any flareups.  Denies any recent injuries no changes.  No swelling or redness and no pain currently.  No concerns. Denies any systemic complaints such as fevers, chills, nausea, vomiting. No acute changes since last appointment, and no other complaints at this time.   Objective: AAO x3, NAD DP/PT pulses palpable bilaterally, CRT less than 3 seconds There is no edema, erythema.  No area pinpoint tenderness.  Flexor, extensor tendons appear to be intact.  No evidence of active gout flares. No pain with calf compression, swelling, warmth, erythema  Assessment: History of gout, currently on allopurinol  Plan: -All treatment options discussed with the patient including all alternatives, risks, complications.  -Have already refilled allopurinol.  I do want to check blood work including uric acid, CMP. -Patient encouraged to call the office with any questions, concerns, change in symptoms.   *No charge for today's visit.  Normally see him at [facility] wellness center  Trula Slade DPM

## 2020-12-21 IMAGING — CT CT ABDOMEN AND PELVIS WITH CONTRAST
2 of 5 series · 16 of 46 positions shown, 18 images · IV contrast (Omni 300)
Comparison: 09/03/2014

CLINICAL DATA: Abdominal pain, diverticulitis

EXAM:
CT ABDOMEN AND PELVIS WITH CONTRAST
TECHNIQUE: Multidetector CT imaging of the abdomen and pelvis was performed
using the standard protocol following bolus administration of
intravenous contrast.
CONTRAST:  100mL OMNIPAQUE IOHEXOL 300 MG/ML  SOLN

[Series 3: a/p w/ 5mm · axial · 0.90mm/px · z∈[-533,-63]mm · 13 of 106 slices shown, 15 images]
[im 6/106  soft-tissue]
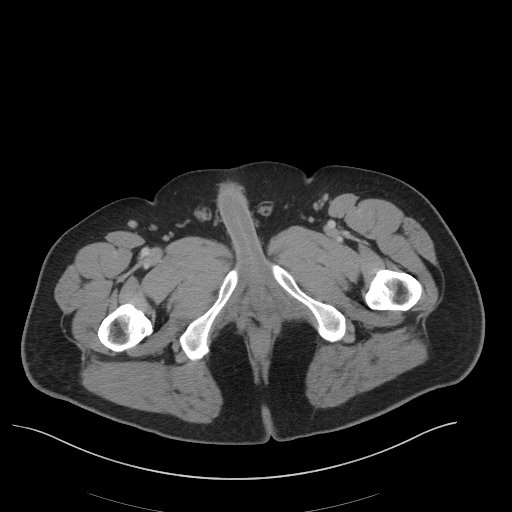
[im 6/106  bone]
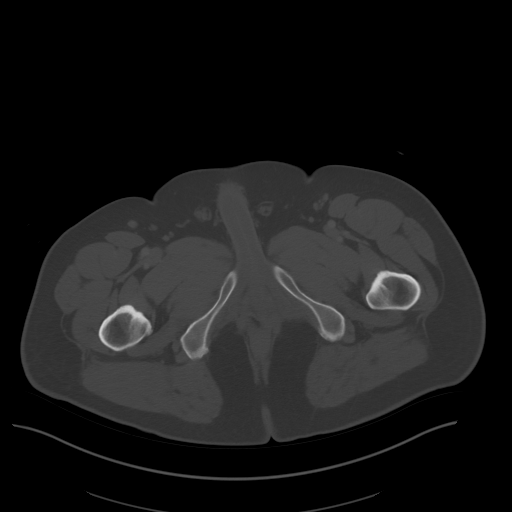
[im 17/106  soft-tissue]
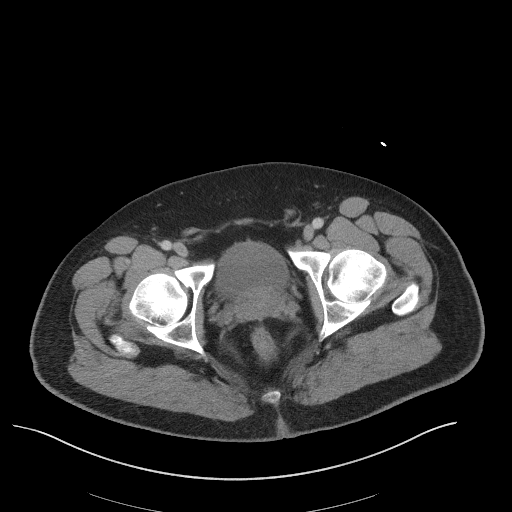
[im 23/106  soft-tissue]
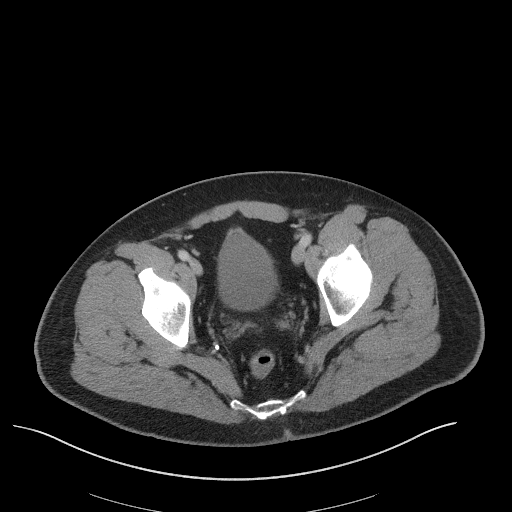
[im 28/106  soft-tissue]
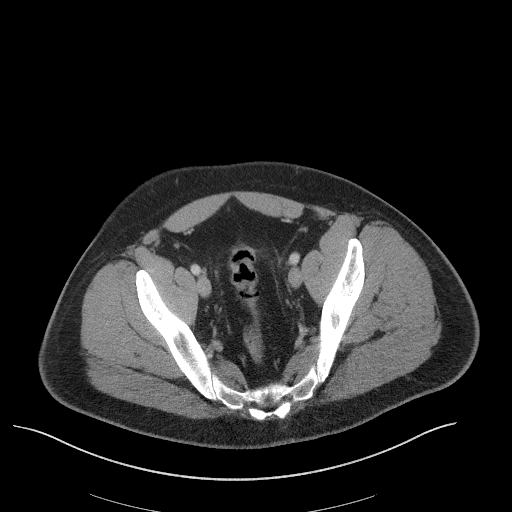
[im 39/106  soft-tissue]
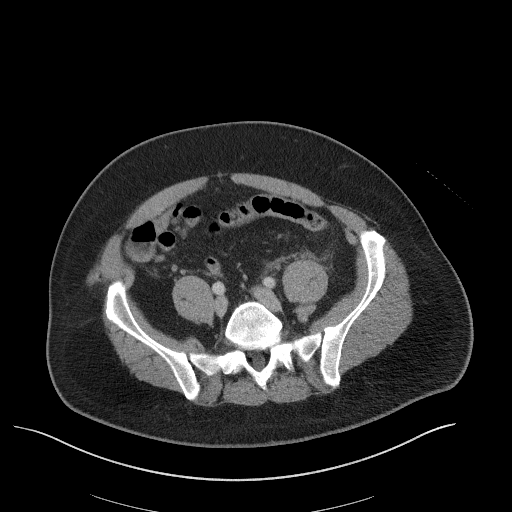
[im 45/106  soft-tissue]
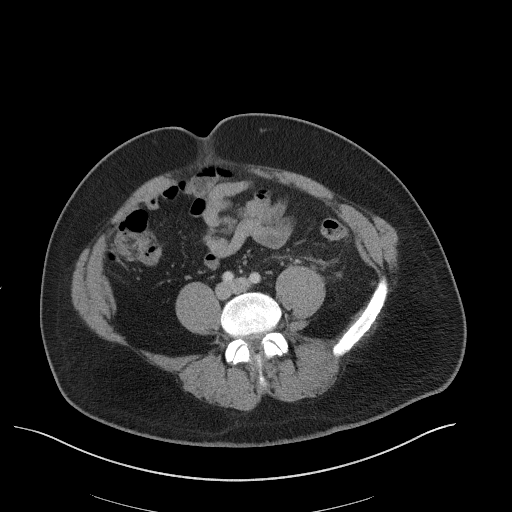
[im 56/106  soft-tissue]
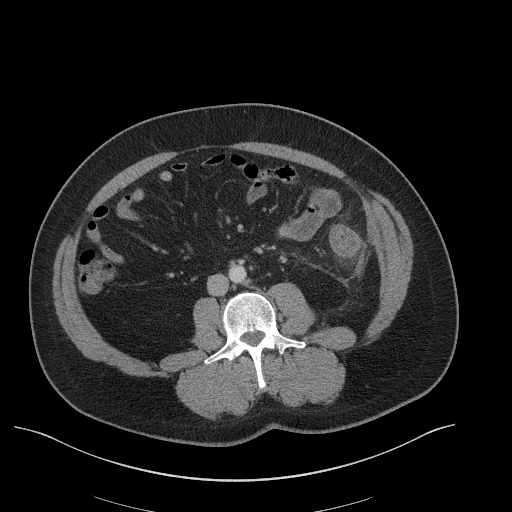
[im 61/106  soft-tissue]
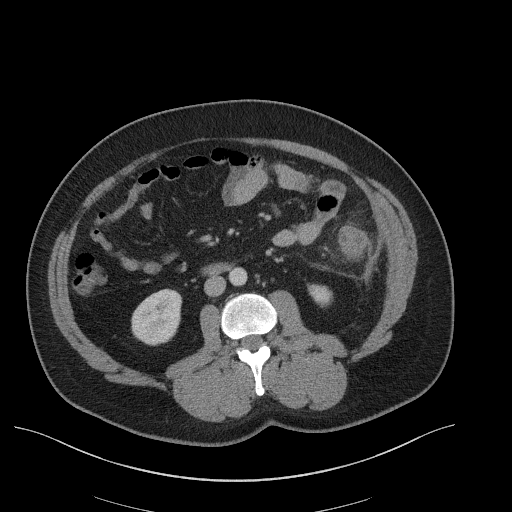
[im 67/106  soft-tissue]
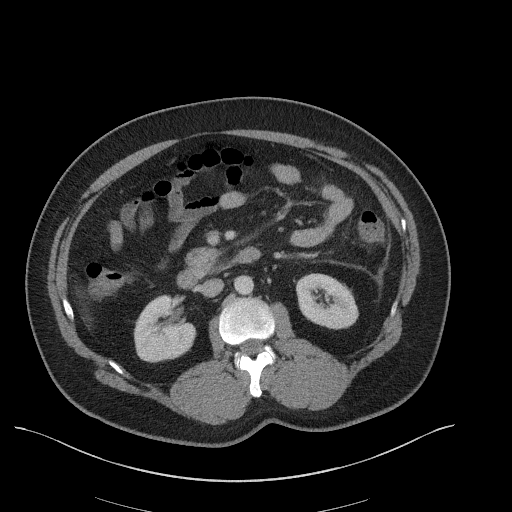
[im 67/106  bone]
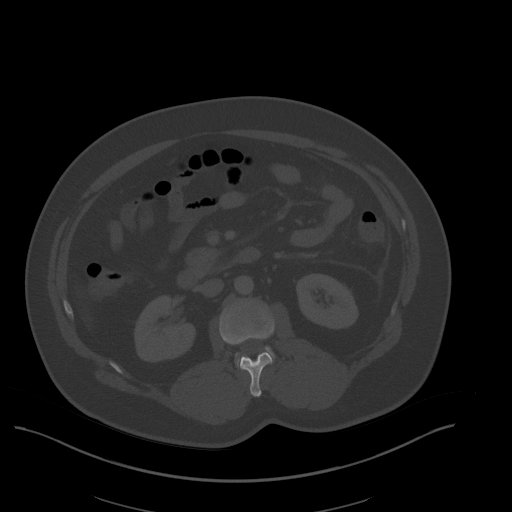
[im 78/106  soft-tissue]
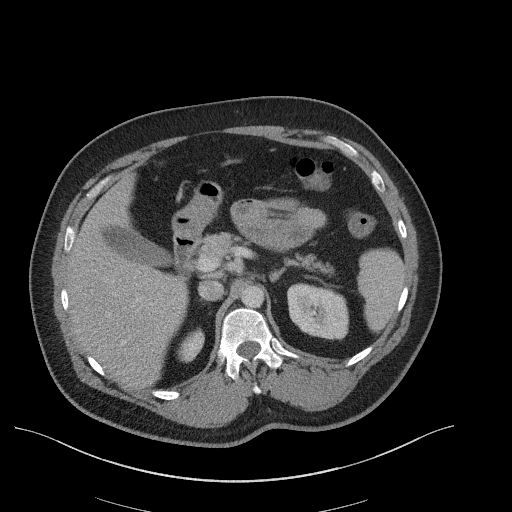
[im 83/106  soft-tissue]
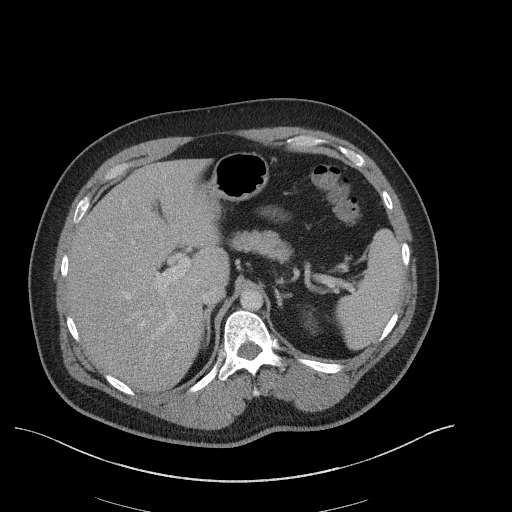
[im 89/106  soft-tissue]
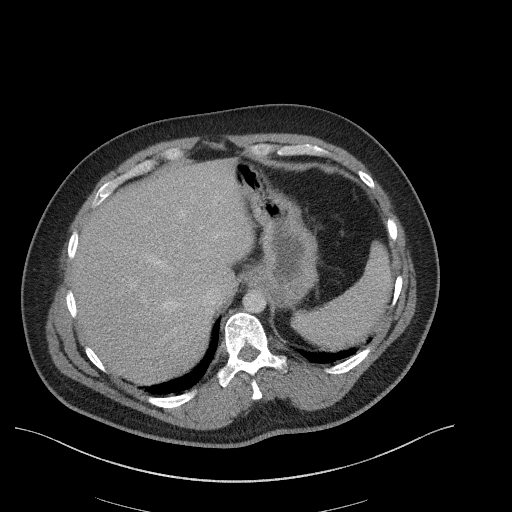
[im 100/106  soft-tissue]
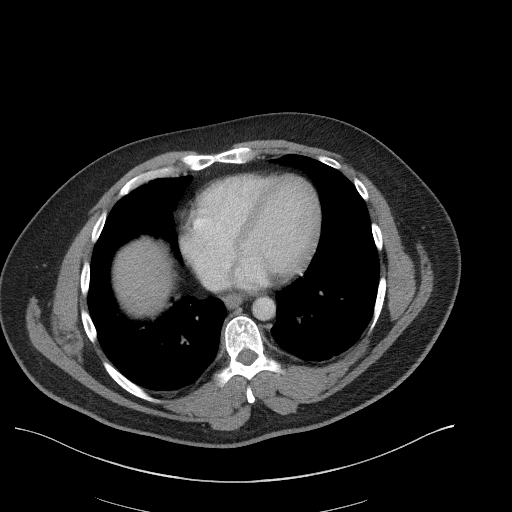

[Series 6: a/p w/ cor · coronal · 0.80mm/px · 3 of 170 slices shown]
[im 57/170  soft-tissue]
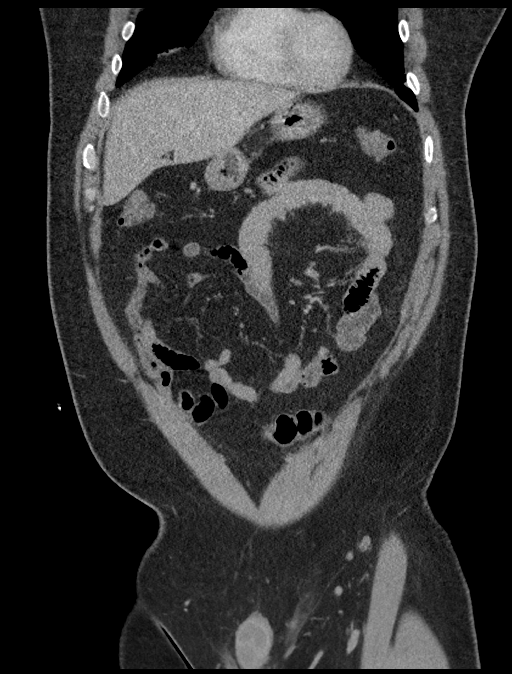
[im 76/170  soft-tissue]
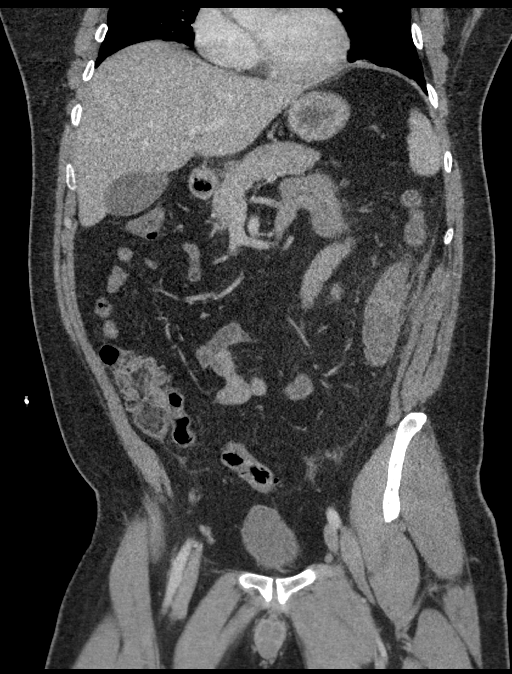
[im 94/170  soft-tissue]
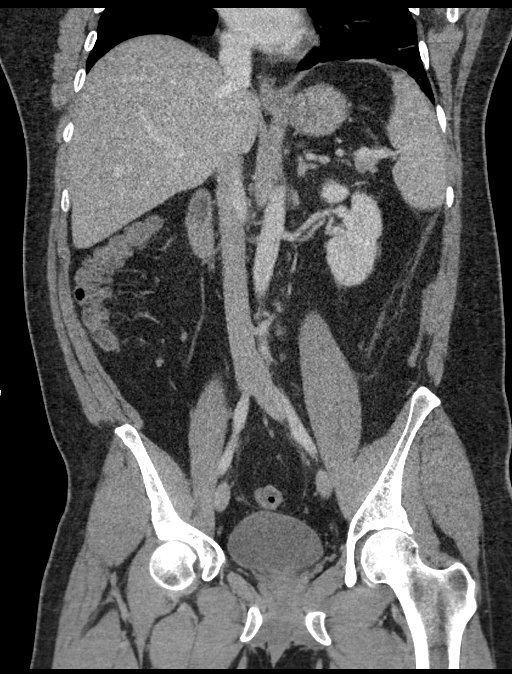

[16 of 46 positions shown; findings below may reference images not displayed]

FINDINGS: Lower chest: Bibasilar bandlike scarring or atelectasis.

Hepatobiliary: No solid liver abnormality is seen. No gallstones,
gallbladder wall thickening, or biliary dilatation.

Pancreas: Unremarkable. No pancreatic ductal dilatation or
surrounding inflammatory changes.

Spleen: Normal in size without significant abnormality.

Adrenals/Urinary Tract: Adrenal glands are unremarkable. Kidneys are
normal, without renal calculi, solid lesion, or hydronephrosis.
Bladder is unremarkable.

Stomach/Bowel: Stomach is within normal limits. Appendix appears
normal. There is occasional descending and sigmoid diverticulosis
with wall thickening and adjacent fat stranding of the mid
descending colon (series 3, image 47, series 6, image 75).

Vascular/Lymphatic: No significant vascular findings are present. No
enlarged abdominal or pelvic lymph nodes.

Reproductive: No mass or other significant abnormality.

Other: No abdominal wall hernia or abnormality. Trace inflammatory
fluid in the left paracolic gutter.

Musculoskeletal: No acute or significant osseous findings.
IMPRESSION: There is occasional descending and sigmoid diverticulosis with wall
thickening and adjacent fat stranding of the mid descending colon
(series 3, image 47, series 6, image 75). There is minimal if any
diverticular disease in this particular segment and findings are
consistent with nonspecific infectious, inflammatory, or ischemic
colitis. Underlying malignancy is not strictly excluded. Consider
evaluation by colonoscopy at the resolution of acute clinical
symptoms.

## 2021-02-28 ENCOUNTER — Other Ambulatory Visit: Payer: Self-pay | Admitting: Podiatry

## 2021-06-01 ENCOUNTER — Other Ambulatory Visit: Payer: Self-pay | Admitting: Podiatry

## 2021-06-01 NOTE — Telephone Encounter (Signed)
Please advise 

## 2021-09-03 ENCOUNTER — Other Ambulatory Visit: Payer: Self-pay | Admitting: Podiatry

## 2021-11-30 ENCOUNTER — Other Ambulatory Visit: Payer: Self-pay | Admitting: Podiatry

## 2022-03-08 ENCOUNTER — Other Ambulatory Visit: Payer: Self-pay | Admitting: Podiatry

## 2022-06-11 ENCOUNTER — Other Ambulatory Visit: Payer: Self-pay | Admitting: Podiatry

## 2022-06-14 ENCOUNTER — Other Ambulatory Visit: Payer: Self-pay

## 2022-06-16 MED ORDER — ALLOPURINOL 100 MG PO TABS: 200.0000 mg | ORAL_TABLET | Freq: Every day | ORAL | 0 refills | Status: DC

## 2022-06-16 NOTE — Telephone Encounter (Signed)
I will send. Please have him schedule at least a virtual visit.

## 2022-06-17 ENCOUNTER — Telehealth: Payer: Self-pay | Admitting: *Deleted

## 2022-06-17 ENCOUNTER — Telehealth: Payer: Self-pay | Admitting: Podiatry

## 2022-06-17 NOTE — Telephone Encounter (Signed)
Patient is calling to request a refill on the allopurinol, has not been seen since 11/2020, offered to schedule a f/u appointment but said that he cannot right now, no health insurance. He said that he had previously discussed this with the physician. Please advise.

## 2022-06-17 NOTE — Telephone Encounter (Signed)
Pt has been sched for a virtual appt

## 2022-06-17 NOTE — Telephone Encounter (Signed)
thanks

## 2022-06-20 ENCOUNTER — Ambulatory Visit (INDEPENDENT_AMBULATORY_CARE_PROVIDER_SITE_OTHER): Payer: Self-pay | Admitting: Podiatry

## 2022-06-20 DIAGNOSIS — M109 Gout, unspecified: Secondary | ICD-10-CM

## 2022-06-20 NOTE — Progress Notes (Unsigned)
I connected with  Elvera Lennox on 06/22/22 by a video enabled telemedicine application and verified that I am speaking with the correct person using two identifiers.   I discussed the limitations of evaluation and management by telemedicine. The patient expressed understanding and agreed to proceed.  Patient: Driving Provider: Office  Subjective: 46 year old male with longstanding history of gout currently on allopurinol.  He states that since increasing the dose to 2 tablets he is doing better he has not had any symptoms of gout.  No swelling or redness that he reports.  Objective: AAO x3, NAD He does not endorse any pain.  No swelling or redness.  He has not had any symptoms of gout since starting allopurinol.    Assessment: Chronic gout, currently on allopurinol  Plan: I refilled the medication for him last week.  He is in continue with this.  I like to get blood work done he recently had blood work done for [employer] and bring by a copy of this for me.  Monitor any gout symptoms.   5 minutes spent with the patient.

## 2022-08-05 ENCOUNTER — Encounter (HOSPITAL_COMMUNITY): Payer: Self-pay | Admitting: Internal Medicine

## 2022-08-05 ENCOUNTER — Inpatient Hospital Stay (HOSPITAL_COMMUNITY)
Admission: EM | Admit: 2022-08-05 | Discharge: 2022-08-08 | DRG: 392 | Disposition: A | Discharge status: Home / Self Care | Payer: Self-pay | Attending: Family Medicine | Admitting: Family Medicine

## 2022-08-05 ENCOUNTER — Emergency Department (HOSPITAL_COMMUNITY): Payer: Self-pay

## 2022-08-05 ENCOUNTER — Other Ambulatory Visit: Payer: Self-pay

## 2022-08-05 DIAGNOSIS — R030 Elevated blood-pressure reading, without diagnosis of hypertension: Secondary | ICD-10-CM | POA: Diagnosis present

## 2022-08-05 DIAGNOSIS — E875 Hyperkalemia: Secondary | ICD-10-CM | POA: Diagnosis present

## 2022-08-05 DIAGNOSIS — Z6835 Body mass index (BMI) 35.0-35.9, adult: Secondary | ICD-10-CM

## 2022-08-05 DIAGNOSIS — K529 Noninfective gastroenteritis and colitis, unspecified: Secondary | ICD-10-CM | POA: Diagnosis present

## 2022-08-05 DIAGNOSIS — G4733 Obstructive sleep apnea (adult) (pediatric): Secondary | ICD-10-CM | POA: Diagnosis present

## 2022-08-05 DIAGNOSIS — Z87891 Personal history of nicotine dependence: Secondary | ICD-10-CM

## 2022-08-05 DIAGNOSIS — E669 Obesity, unspecified: Secondary | ICD-10-CM | POA: Diagnosis present

## 2022-08-05 DIAGNOSIS — M109 Gout, unspecified: Secondary | ICD-10-CM | POA: Diagnosis present

## 2022-08-05 DIAGNOSIS — N179 Acute kidney failure, unspecified: Secondary | ICD-10-CM | POA: Diagnosis present

## 2022-08-05 DIAGNOSIS — Z79899 Other long term (current) drug therapy: Secondary | ICD-10-CM

## 2022-08-05 DIAGNOSIS — K5792 Diverticulitis of intestine, part unspecified, without perforation or abscess without bleeding: Secondary | ICD-10-CM | POA: Diagnosis present

## 2022-08-05 DIAGNOSIS — N200 Calculus of kidney: Secondary | ICD-10-CM | POA: Diagnosis present

## 2022-08-05 DIAGNOSIS — K5732 Diverticulitis of large intestine without perforation or abscess without bleeding: Secondary | ICD-10-CM

## 2022-08-05 DIAGNOSIS — K5720 Diverticulitis of large intestine with perforation and abscess without bleeding: Principal | ICD-10-CM | POA: Diagnosis present

## 2022-08-05 DIAGNOSIS — K567 Ileus, unspecified: Secondary | ICD-10-CM | POA: Diagnosis present

## 2022-08-05 DIAGNOSIS — R1032 Left lower quadrant pain: Principal | ICD-10-CM

## 2022-08-05 DIAGNOSIS — Z87442 Personal history of urinary calculi: Secondary | ICD-10-CM

## 2022-08-05 LAB — URINALYSIS, ROUTINE W REFLEX MICROSCOPIC
Bilirubin Urine: NEGATIVE
Glucose, UA: NEGATIVE mg/dL
Ketones, ur: 5 mg/dL — AB
Leukocytes,Ua: NEGATIVE
Nitrite: NEGATIVE
Protein, ur: 30 mg/dL — AB
Specific Gravity, Urine: 1.035 — ABNORMAL HIGH (ref 1.005–1.030)
pH: 5 (ref 5.0–8.0)

## 2022-08-05 LAB — CBC
HCT: 47.5 % (ref 39.0–52.0)
Hemoglobin: 16.3 g/dL (ref 13.0–17.0)
MCH: 30.6 pg (ref 26.0–34.0)
MCHC: 34.3 g/dL (ref 30.0–36.0)
MCV: 89.1 fL (ref 80.0–100.0)
Platelets: 168 10*3/uL (ref 150–400)
RBC: 5.33 MIL/uL (ref 4.22–5.81)
RDW: 12.9 % (ref 11.5–15.5)
WBC: 11.9 10*3/uL — ABNORMAL HIGH (ref 4.0–10.5)
nRBC: 0 % (ref 0.0–0.2)

## 2022-08-05 LAB — COMPREHENSIVE METABOLIC PANEL
ALT: 45 U/L — ABNORMAL HIGH (ref 0–44)
AST: 37 U/L (ref 15–41)
Albumin: 3.9 g/dL (ref 3.5–5.0)
Alkaline Phosphatase: 67 U/L (ref 38–126)
Anion gap: 14 (ref 5–15)
BUN: 18 mg/dL (ref 6–20)
CO2: 23 mmol/L (ref 22–32)
Calcium: 9.5 mg/dL (ref 8.9–10.3)
Chloride: 101 mmol/L (ref 98–111)
Creatinine, Ser: 1.42 mg/dL — ABNORMAL HIGH (ref 0.61–1.24)
GFR, Estimated: >60 mL/min (ref >60)
Glucose, Bld: 105 mg/dL — ABNORMAL HIGH (ref 70–99)
Potassium: 4.3 mmol/L (ref 3.5–5.1)
Sodium: 138 mmol/L (ref 135–145)
Total Bilirubin: 1.3 mg/dL — ABNORMAL HIGH (ref 0.3–1.2)
Total Protein: 8 g/dL (ref 6.5–8.1)

## 2022-08-05 LAB — LIPASE, BLOOD: Lipase: 28 U/L (ref 11–51)

## 2022-08-05 LAB — LACTIC ACID, PLASMA: Lactic Acid, Venous: 1.2 mmol/L (ref 0.5–1.9)

## 2022-08-05 MED ORDER — PIPERACILLIN-TAZOBACTAM 3.375 G IVPB 30 MIN: 3.3750 g | Freq: Once | INTRAVENOUS | Status: DC

## 2022-08-05 MED ORDER — FENTANYL CITRATE PF 50 MCG/ML IJ SOSY
50.0000 ug | PREFILLED_SYRINGE | Freq: Once | INTRAMUSCULAR | Status: AC
  Administered 2022-08-05: 50 ug via INTRAVENOUS
  Filled 2022-08-05: qty 1

## 2022-08-05 MED ORDER — ONDANSETRON HCL 4 MG/2ML IJ SOLN: 4.0000 mg | Freq: Four times a day (QID) | INTRAMUSCULAR | Status: DC | PRN

## 2022-08-05 MED ORDER — ONDANSETRON HCL 4 MG/2ML IJ SOLN
4.0000 mg | Freq: Once | INTRAMUSCULAR | Status: AC
  Administered 2022-08-05: 4 mg via INTRAVENOUS
  Filled 2022-08-05: qty 2

## 2022-08-05 MED ORDER — ENOXAPARIN SODIUM 40 MG/0.4ML IJ SOSY
40.0000 mg | PREFILLED_SYRINGE | INTRAMUSCULAR | Status: DC
  Administered 2022-08-05 – 2022-08-07 (×3): 40 mg via SUBCUTANEOUS
  Filled 2022-08-05 (×3): qty 0.4

## 2022-08-05 MED ORDER — ACETAMINOPHEN 325 MG PO TABS
650.0000 mg | ORAL_TABLET | Freq: Four times a day (QID) | ORAL | Status: DC | PRN
  Administered 2022-08-06 – 2022-08-07 (×4): 650 mg via ORAL
  Filled 2022-08-05 (×4): qty 2

## 2022-08-05 MED ORDER — SODIUM CHLORIDE 0.9 % IV BOLUS
1000.0000 mL | Freq: Once | INTRAVENOUS | Status: AC
  Administered 2022-08-05: 1000 mL via INTRAVENOUS

## 2022-08-05 MED ORDER — ONDANSETRON HCL 4 MG PO TABS: 4.0000 mg | ORAL_TABLET | Freq: Four times a day (QID) | ORAL | Status: DC | PRN

## 2022-08-05 MED ORDER — IOHEXOL 350 MG/ML SOLN
75.0000 mL | Freq: Once | INTRAVENOUS | Status: AC | PRN
  Administered 2022-08-05: 75 mL via INTRAVENOUS

## 2022-08-05 MED ORDER — POTASSIUM CHLORIDE IN NACL 20-0.45 MEQ/L-% IV SOLN: INTRAVENOUS | Status: DC

## 2022-08-05 MED ORDER — PIPERACILLIN-TAZOBACTAM 3.375 G IVPB: 3.3750 g | Freq: Three times a day (TID) | INTRAVENOUS | Status: DC

## 2022-08-05 MED ORDER — POTASSIUM CHLORIDE IN NACL 20-0.45 MEQ/L-% IV SOLN
INTRAVENOUS | Status: DC
  Filled 2022-08-05 (×6): qty 1000

## 2022-08-05 MED ORDER — SODIUM CHLORIDE 0.9% FLUSH
3.0000 mL | Freq: Two times a day (BID) | INTRAVENOUS | Status: DC
  Administered 2022-08-05 – 2022-08-07 (×3): 3 mL via INTRAVENOUS

## 2022-08-05 NOTE — Progress Notes (Signed)
Roger Jennings is a 46 y.o. male patient admitted. Awake, alert - oriented  X 4 - no acute distress noted.  VSS - Blood pressure 137/81, pulse 98, temperature (!) 100.6 F (38.1 C), temperature source Oral, resp. rate 17, height '5\' 10"'$  (1.778 m), weight 113.4 kg, SpO2 99 %.    IV in place, occlusive dsg intact without redness.     Will cont to eval and treat per MD orders.  Vidal Schwalbe, RN 08/05/2022 10:29 PM

## 2022-08-05 NOTE — Consult Note (Signed)
Reason for Consult:diverticulitis Referring Provider: Gerald Stabs Tegeler  Roger Jennings is an 46 y.o. male.  HPI: He has been having abdominal pain for the last 5 days. It is in the lower left abdomen. He has not had much of an appetite and has not had normal bowel movements. He has been resting without improvement. He has had pains like this multiple times. He was hospitalized for diverticulitis in 2020 and has had other attacks where he takes time off work and rests with tylenol. He had a colonoscopy in 2020.  Past Medical History:  Diagnosis Date   Allergy    Anxiety    Diverticulitis    Gout    Kidney stones    OSA on CPAP    Sleep apnea     Past Surgical History:  Procedure Laterality Date   NO PAST SURGERIES      Family History  Problem Relation Age of Onset   Diabetes Mellitus II Mother    Fibromyalgia Mother    Hypertension Father    Colon polyps Father    Coronary artery disease Paternal Grandmother    Colon cancer Neg Hx    Esophageal cancer Neg Hx    Gallbladder disease Neg Hx    Rectal cancer Neg Hx    Stomach cancer Neg Hx    Inflammatory bowel disease Neg Hx    Liver disease Neg Hx    Pancreatic cancer Neg Hx     Social History:  reports that he quit smoking about 20 years ago. His smoking use included cigarettes. He has never used smokeless tobacco. He reports that he does not drink alcohol and does not use drugs.  Allergies:  Allergies  Allergen Reactions   Indomethacin Er Other (See Comments)    Gastrointestinal upset    Medications: I have reviewed the patient's current medications.  Results for orders placed or performed during the hospital encounter of 08/05/22 (from the past 48 hour(s))  Lipase, blood     Status: None   Collection Time: 08/05/22  9:20 AM  Result Value Ref Range   Lipase 28 11 - 51 U/L    Comment: Performed at [facility], [address], [place], [place] [address]  Comprehensive metabolic panel     Status: Abnormal    Collection Time: 08/05/22  9:20 AM  Result Value Ref Range   Sodium 138 135 - 145 mmol/L   Potassium 4.3 3.5 - 5.1 mmol/L    Comment: HEMOLYSIS AT THIS LEVEL MAY AFFECT RESULT   Chloride 101 98 - 111 mmol/L   CO2 23 22 - 32 mmol/L   Glucose, Bld 105 (H) 70 - 99 mg/dL    Comment: Glucose reference range applies only to samples taken after fasting for at least 8 hours.   BUN 18 6 - 20 mg/dL   Creatinine, Ser 1.42 (H) 0.61 - 1.24 mg/dL   Calcium 9.5 8.9 - 10.3 mg/dL   Total Protein 8.0 6.5 - 8.1 g/dL   Albumin 3.9 3.5 - 5.0 g/dL   AST 37 15 - 41 U/L    Comment: HEMOLYSIS AT THIS LEVEL MAY AFFECT RESULT   ALT 45 (H) 0 - 44 U/L    Comment: HEMOLYSIS AT THIS LEVEL MAY AFFECT RESULT   Alkaline Phosphatase 67 38 - 126 U/L   Total Bilirubin 1.3 (H) 0.3 - 1.2 mg/dL    Comment: HEMOLYSIS AT THIS LEVEL MAY AFFECT RESULT   GFR, Estimated >60 >60 mL/min  Comment: (NOTE) Calculated using the CKD-EPI Creatinine Equation (2021)    Anion gap 14 5 - 15    Comment: Performed at [facility], [place] [address], [place], [place] [address]  CBC     Status: Abnormal   Collection Time: 08/05/22  9:20 AM  Result Value Ref Range   WBC 11.9 (H) 4.0 - 10.5 K/uL   RBC 5.33 4.22 - 5.81 MIL/uL   Hemoglobin 16.3 13.0 - 17.0 g/dL   HCT 47.5 39.0 - 52.0 %   MCV 89.1 80.0 - 100.0 fL   MCH 30.6 26.0 - 34.0 pg   MCHC 34.3 30.0 - 36.0 g/dL   RDW 12.9 11.5 - 15.5 %   Platelets 168 150 - 400 K/uL   nRBC 0.0 0.0 - 0.2 %    Comment: Performed at [facility], [place] [address] [place], [place] [address]  Urinalysis, Routine w reflex microscopic     Status: Abnormal   Collection Time: 08/05/22  9:51 AM  Result Value Ref Range   Color, Urine AMBER (A) YELLOW    Comment: BIOCHEMICALS MAY BE AFFECTED BY COLOR   APPearance HAZY (A) CLEAR   Specific Gravity, Urine 1.035 (H) 1.005 - 1.030   pH 5.0 5.0 - 8.0   Glucose, UA NEGATIVE NEGATIVE mg/dL   Hgb urine dipstick SMALL (A) NEGATIVE   Bilirubin Urine  NEGATIVE NEGATIVE   Ketones, ur 5 (A) NEGATIVE mg/dL   Protein, ur 30 (A) NEGATIVE mg/dL   Nitrite NEGATIVE NEGATIVE   Leukocytes,Ua NEGATIVE NEGATIVE   RBC / HPF 0-5 0 - 5 RBC/hpf   WBC, UA 0-5 0 - 5 WBC/hpf   Bacteria, UA RARE (A) NONE SEEN   Squamous Epithelial / LPF 0-5 0 - 5   Mucus PRESENT    Hyaline Casts, UA PRESENT     Comment: Performed at [facility], [address], [place], [place] [address]  Lactic acid, plasma     Status: None   Collection Time: 08/05/22  9:21 PM  Result Value Ref Range   Lactic Acid, Venous 1.2 0.5 - 1.9 mmol/L    Comment: Performed at [facility] [address] [place],  [address]    CT ABDOMEN PELVIS W CONTRAST  Result Date: 08/05/2022 CLINICAL DATA:  Left lower quadrant pain, history diverticulitis EXAM: CT ABDOMEN AND PELVIS WITH CONTRAST TECHNIQUE: Multidetector CT imaging of the abdomen and pelvis was performed using the standard protocol following bolus administration of intravenous contrast. RADIATION DOSE REDUCTION: This exam was performed according to the departmental dose-optimization program which includes automated exposure control, adjustment of the mA and/or kV according to patient size and/or use of iterative reconstruction technique. CONTRAST:  62m OMNIPAQUE IOHEXOL 350 MG/ML SOLN COMPARISON:  CT 05/15/2019 FINDINGS: Lower chest: No pleural or pericardial effusion. Linear scarring or atelectasis in the lung bases, improved since previous. Hepatobiliary: No focal liver abnormality is seen. No gallstones, gallbladder wall thickening, or biliary dilatation. Pancreas: Unremarkable. No pancreatic ductal dilatation or surrounding inflammatory changes. Spleen: Normal in size without focal abnormality. Adrenals/Urinary Tract: No adrenal mass. 2 mm left lower pole renal calculus. Symmetric renal enhancement without focal lesion or hydronephrosis. Urinary bladder is incompletely distended. Stomach/Bowel: Stomach decompressed. Small  bowel nondistended, unremarkable. Normal appendix. The colon is incompletely distended. There is marked wall thickening and in the proximal sigmoid colon with effacement of the lumen and moderate regional inflammatory/edematous changes, new since previous. Low-attenuation poorly marginated regions in the wall  may represent developing intramural abscesses, but no drainable components. A few small regional diverticula identified. Vascular/Lymphatic: No significant vascular findings are present. No enlarged abdominal or pelvic lymph nodes. Reproductive: Prostate is unremarkable. Other: No ascites.  No free air. Musculoskeletal: Benign bone island in the left femoral neck, seen back to 11/07/2007. Mild narrowing of interspaces L4-5 and L5-S1. No acute findings. IMPRESSION: 1. Moderate sigmoid diverticulitis, with possible developing intramural abscesses but no drainable components. Consider follow-up to confirm appropriate resolution and exclude mucosal lesion. 2. Left nephrolithiasis without hydronephrosis. Electronically Signed   By: Lucrezia Europe M.D.   On: 08/05/2022 12:35    Review of Systems  Constitutional: Negative.   HENT: Negative.    Eyes: Negative.   Respiratory: Negative.    Cardiovascular: Negative.   Gastrointestinal:  Positive for abdominal pain and nausea.  Genitourinary: Negative.   Musculoskeletal: Negative.   Skin: Negative.   Neurological: Negative.   Endo/Heme/Allergies: Negative.   Psychiatric/Behavioral: Negative.      PE Blood pressure 137/81, pulse 98, temperature (!) 100.6 F (38.1 C), temperature source Oral, resp. rate 17, height '5\' 10"'$  (1.778 m), weight 113.4 kg, SpO2 99 %. Constitutional: NAD; conversant; no deformities Eyes: Moist conjunctiva; no lid lag; anicteric; PERRL Neck: Trachea midline; no thyromegaly Lungs: Normal respiratory effort; no tactile fremitus CV: RRR; no palpable thrills; no pitting edema GI: Abd lower left quadrant with slight tendernes; no  palpable hepatosplenomegaly MSK: Normal gait; no clubbing/cyanosis Psychiatric: Appropriate affect; alert and oriented x3 Lymphatic: No palpable cervical or axillary lymphadenopathy Skin: No major subcutaneous nodules. Warm and dry   Assessment/Plan: 46 yo male with diveriticulitis with some developing intramural phlegmon -IV abx -clear liquids  I reviewed last 24 h vitals and pain scores, last 48 h intake and output, last 24 h labs and trends, and last 24 h imaging results.  This care required high  level of medical decision making.   Arta Bruce Roger Jennings 08/05/2022, 11:16 PM

## 2022-08-05 NOTE — H&P (Addendum)
History and Physical    Patient: Roger Jennings QPY:[id number] DOB: [birth year] DOA: 08/05/2022 DOS: the patient was seen and examined on 08/05/2022 PCP: Patient, No Pcp Per  Patient coming from: Home  Chief Complaint:  Chief Complaint  Patient presents with   Abdominal Pain   Constipation   HPI: Roger Jennings is a 47 y.o. male with medical history significant of Gout, Diverticulitis who presents with complains of abdominal pain with associated nausea of 1 week duration.  The patient reports that he started having lower left quadrant abdominal discomfort approximately 1 week prior and was taking pain medications with limited effect. The abdominal pain was associated with night sweats, chills, and feeling feverish for the past several nights. Temp night prior to presentation was 101.79F. He has also had significantly diminished appetite and low energy and has been only able to eat small amounts. He reports constipation as well for approximately the last week. The day of presentation, he had worsening nausea and given his fever, came in for evaluation.  Of note, he has prior history of diverticulitis and reports similar symptoms approximately ever year to 1.5 yrs that he typically treats with pain medications. He has not seen GI for a few years.  In the ED, he was found to be febrile to 100.4F and hemodynamically stable on room air. CT A/P demonstrated proximal sigmoid diverticultis/colitis with phlegmon. Surgery was consulted and recommend conservative management. He was given 1 dose of IV zosyn and admitted for further IV antibiotics.   Review of Systems: As mentioned in the history of present illness. All other systems reviewed and are negative. Past Medical History:  Diagnosis Date   Allergy    Anxiety    Diverticulitis    Gout    Kidney stones    OSA on CPAP    Sleep apnea    Past Surgical History:  Procedure Laterality Date   NO PAST SURGERIES     Social History:  reports  that he quit smoking about 20 years ago. His smoking use included cigarettes. He has never used smokeless tobacco. He reports that he does not drink alcohol and does not use drugs.  Allergies  Allergen Reactions   Indomethacin Er Other (See Comments)    Gastrointestinal upset    Family History  Problem Relation Age of Onset   Diabetes Mellitus II Mother    Fibromyalgia Mother    Hypertension Father    Colon polyps Father    Coronary artery disease Paternal Grandmother    Colon cancer Neg Hx    Esophageal cancer Neg Hx    Gallbladder disease Neg Hx    Rectal cancer Neg Hx    Stomach cancer Neg Hx    Inflammatory bowel disease Neg Hx    Liver disease Neg Hx    Pancreatic cancer Neg Hx     Prior to Admission medications   Medication Sig Start Date End Date Taking? Authorizing Provider  allopurinol (ZYLOPRIM) 100 MG tablet Take 2 tablets (200 mg total) by mouth daily. 06/16/22   Trula Slade, DPM    Physical Exam: Vitals:   08/05/22 0909 08/05/22 1353 08/05/22 1900 08/05/22 1924  BP:  (!) 147/93  136/86  Pulse:  97  95  Resp:  18  18  Temp:  99.9 F (37.7 C) (!) 100.6 F (38.1 C)   TempSrc:   Oral   SpO2:  98%  95%  Weight: 113.4 kg     Height: '5\' 10"'$  (1.778  m)      Physical Exam Constitutional:      General: He is not in acute distress. HENT:     Head: Normocephalic and atraumatic.  Eyes:     Extraocular Movements: Extraocular movements intact.  Cardiovascular:     Rate and Rhythm: Normal rate and regular rhythm.     Heart sounds: No murmur heard. Pulmonary:     Effort: Pulmonary effort is normal. No respiratory distress.  Abdominal:     General: Bowel sounds are normal. There is distension.     Tenderness: There is abdominal tenderness in the left lower quadrant. There is no guarding or rebound.  Skin:    General: Skin is warm and dry.  Neurological:     General: No focal deficit present.     Mental Status: He is alert and oriented to person, place,  and time.     Data Reviewed:  CT A/P with evidence of colonic distention and inflammation c/w colitis.  Assessment and Plan: No notes have been filed under this hospital service. Service: Hospitalist [name] is a 46 yo M w/PMHx of recurrent diverticulitis, obesity, gout who presented with abdominal pain and found to have diverticulitis/colitis on CT imaging.  #Diverticulitis/Colitis CT evidence of inflammation w/early phlegmon, elevated WBC w/LLQ abd pain, fever and nausea - c/w Zosyn IV Q6H; can transition to cipro/flagyl when moving bowels/able to take PO; duration ~7 days - 1L IVF - clear liquid diet - Given recurrence, patient should reconnect with GI  #Acute kidney injury Baseline Cr ~1.1, 1.4 on admission - giving 1L IVF w/continue mIVF x 12h - recheck Cr in AM  #Nephrolithiasis, asymptomatic 25m non-obstructing stone (L kidney) on CT - Patient reports hx of prior nephrolithiasis and family history, would be reasonable to f/u with Nephrology outpatient for further workup to reduce recurrence   #Elevated blood pressure Likely reactive, will CTM   Advance Care Planning:   Code Status: Full Code   Consults: General Surgery  Family Communication: No family at bedside, patient reports that he is fine with team calling his mother, brother, or girlfriend for decision making or updates.  Severity of Illness: The appropriate patient status for this patient is INPATIENT. Inpatient status is judged to be reasonable and necessary in order to provide the required intensity of service to ensure the patient's safety. The patient's presenting symptoms, physical exam findings, and initial radiographic and laboratory data in the context of their chronic comorbidities is felt to place them at high risk for further clinical deterioration. Furthermore, it is not anticipated that the patient will be medically stable for discharge from the hospital within 2 midnights of admission.   * I  certify that at the point of admission it is my clinical judgment that the patient will require inpatient hospital care spanning beyond 2 midnights from the point of admission due to high intensity of service, high risk for further deterioration and high frequency of surveillance required.*  Author: CCecille Rubin MD MPH 08/05/2022 7:49 PM  For on call review [email or web address]

## 2022-08-05 NOTE — ED Triage Notes (Signed)
Pt. Stated, Roger Jennings had a flare up of diverticulitis for a week and not able to have a normal bowel movement.

## 2022-08-05 NOTE — ED Provider Notes (Signed)
[name] Note   CSN: [id number] Arrival date & time: 08/05/22  0830     History  Chief Complaint  Patient presents with   Abdominal Pain   Constipation    Roger Jennings is a 46 y.o. male.  The history is provided by the patient and medical records. No language interpreter was used.  Abdominal Pain Pain location:  LLQ Pain quality: aching   Pain radiates to:  Does not radiate Pain severity:  Moderate Onset quality:  Gradual Duration:  1 week Timing:  Constant Progression:  Worsening Chronicity:  Recurrent Context: not trauma   Relieved by:  Nothing Worsened by:  Palpation Ineffective treatments:  None tried Associated symptoms: chills, constipation, fatigue, fever, nausea and vomiting   Associated symptoms: no chest pain, no cough, no diarrhea, no dysuria and no shortness of breath   Constipation Associated symptoms: abdominal pain, fever, nausea and vomiting   Associated symptoms: no back pain, no diarrhea and no dysuria        Home Medications Prior to Admission medications   Medication Sig Start Date End Date Taking? Authorizing Provider  allopurinol (ZYLOPRIM) 100 MG tablet Take 2 tablets (200 mg total) by mouth daily. 06/16/22   Trula Slade, DPM      Allergies    Indomethacin er    Review of Systems   Review of Systems  Constitutional:  Positive for chills, fatigue and fever.  HENT:  Negative for congestion.   Respiratory:  Negative for cough, chest tightness, shortness of breath and wheezing.   Cardiovascular:  Negative for chest pain.  Gastrointestinal:  Positive for abdominal pain, constipation, nausea and vomiting. Negative for diarrhea.  Genitourinary:  Negative for dysuria and flank pain.  Musculoskeletal:  Negative for back pain, neck pain and neck stiffness.  Skin:  Negative for rash and wound.  Neurological:  Negative for weakness, light-headedness and headaches.   Psychiatric/Behavioral:  Negative for agitation and confusion.   All other systems reviewed and are negative.   Physical Exam Updated Vital Signs BP (!) 147/93   Pulse 97   Temp 99.9 F (37.7 C)   Resp 18   Ht '5\' 10"'$  (1.778 m)   Wt 113.4 kg   SpO2 98%   BMI 35.87 kg/m  Physical Exam Vitals and nursing note reviewed.  Constitutional:      General: He is not in acute distress.    Appearance: He is well-developed. He is not ill-appearing, toxic-appearing or diaphoretic.  HENT:     Head: Normocephalic and atraumatic.     Mouth/Throat:     Mouth: Mucous membranes are dry.  Eyes:     Conjunctiva/sclera: Conjunctivae normal.  Cardiovascular:     Rate and Rhythm: Normal rate and regular rhythm.     Heart sounds: No murmur heard. Pulmonary:     Effort: Pulmonary effort is normal. No respiratory distress.     Breath sounds: Normal breath sounds.  Abdominal:     General: Abdomen is flat. There is no distension.     Palpations: Abdomen is soft.     Tenderness: There is abdominal tenderness in the left lower quadrant. There is no right CVA tenderness, left CVA tenderness, guarding or rebound.  Musculoskeletal:        General: No swelling or tenderness.     Cervical back: Neck supple.  Skin:    General: Skin is warm and dry.     Capillary Refill: Capillary refill takes less than  2 seconds.     Findings: No erythema.  Neurological:     General: No focal deficit present.     Mental Status: He is alert.  Psychiatric:        Mood and Affect: Mood normal.     ED Results / Procedures / Treatments   Labs (all labs ordered are listed, but only abnormal results are displayed) Labs Reviewed  COMPREHENSIVE METABOLIC PANEL - Abnormal; Notable for the following components:      Result Value   Glucose, Bld 105 (*)    Creatinine, Ser 1.42 (*)    ALT 45 (*)    Total Bilirubin 1.3 (*)    All other components within normal limits  CBC - Abnormal; Notable for the following components:    WBC 11.9 (*)    All other components within normal limits  URINALYSIS, ROUTINE W REFLEX MICROSCOPIC - Abnormal; Notable for the following components:   Color, Urine AMBER (*)    APPearance HAZY (*)    Specific Gravity, Urine 1.035 (*)    Hgb urine dipstick SMALL (*)    Ketones, ur 5 (*)    Protein, ur 30 (*)    Bacteria, UA RARE (*)    All other components within normal limits  CULTURE, BLOOD (ROUTINE X 2)  CULTURE, BLOOD (ROUTINE X 2)  LIPASE, BLOOD  LACTIC ACID, PLASMA  HIV ANTIBODY (ROUTINE TESTING W REFLEX)  CBC  BASIC METABOLIC PANEL    EKG None  Radiology CT ABDOMEN PELVIS W CONTRAST  Result Date: 08/05/2022 CLINICAL DATA:  Left lower quadrant pain, history diverticulitis EXAM: CT ABDOMEN AND PELVIS WITH CONTRAST TECHNIQUE: Multidetector CT imaging of the abdomen and pelvis was performed using the standard protocol following bolus administration of intravenous contrast. RADIATION DOSE REDUCTION: This exam was performed according to the departmental dose-optimization program which includes automated exposure control, adjustment of the mA and/or kV according to patient size and/or use of iterative reconstruction technique. CONTRAST:  35m OMNIPAQUE IOHEXOL 350 MG/ML SOLN COMPARISON:  CT 05/15/2019 FINDINGS: Lower chest: No pleural or pericardial effusion. Linear scarring or atelectasis in the lung bases, improved since previous. Hepatobiliary: No focal liver abnormality is seen. No gallstones, gallbladder wall thickening, or biliary dilatation. Pancreas: Unremarkable. No pancreatic ductal dilatation or surrounding inflammatory changes. Spleen: Normal in size without focal abnormality. Adrenals/Urinary Tract: No adrenal mass. 2 mm left lower pole renal calculus. Symmetric renal enhancement without focal lesion or hydronephrosis. Urinary bladder is incompletely distended. Stomach/Bowel: Stomach decompressed. Small bowel nondistended, unremarkable. Normal appendix. The colon is  incompletely distended. There is marked wall thickening and in the proximal sigmoid colon with effacement of the lumen and moderate regional inflammatory/edematous changes, new since previous. Low-attenuation poorly marginated regions in the wall may represent developing intramural abscesses, but no drainable components. A few small regional diverticula identified. Vascular/Lymphatic: No significant vascular findings are present. No enlarged abdominal or pelvic lymph nodes. Reproductive: Prostate is unremarkable. Other: No ascites.  No free air. Musculoskeletal: Benign bone island in the left femoral neck, seen back to 11/07/2007. Mild narrowing of interspaces L4-5 and L5-S1. No acute findings. IMPRESSION: 1. Moderate sigmoid diverticulitis, with possible developing intramural abscesses but no drainable components. Consider follow-up to confirm appropriate resolution and exclude mucosal lesion. 2. Left nephrolithiasis without hydronephrosis. Electronically Signed   By: DLucrezia EuropeM.D.   On: 08/05/2022 12:35    Procedures Procedures   CRITICAL CARE Performed by: CGwenyth AllegraTegeler Total critical care time: 35 minutes Critical care time was  exclusive of separately billable procedures and treating other patients. Critical care was necessary to treat or prevent imminent or life-threatening deterioration. Critical care was time spent personally by me on the following activities: development of treatment plan with patient and/or surrogate as well as nursing, discussions with consultants, evaluation of patient's response to treatment, examination of patient, obtaining history from patient or surrogate, ordering and performing treatments and interventions, ordering and review of laboratory studies, ordering and review of radiographic studies, pulse oximetry and re-evaluation of patient's condition.   Medications Ordered in ED Medications  iohexol (OMNIPAQUE) 350 MG/ML injection 75 mL (75 mLs Intravenous  Contrast Given 08/05/22 1221)    ED Course/ Medical Decision Making/ A&P                           Medical Decision Making Amount and/or Complexity of Data Reviewed Labs: ordered.  Risk Prescription drug management. Decision regarding hospitalization.    Roger Jennings is a 46 y.o. male with a past medical history significant for previous diverticulitis, anxiety, kidney stones who presents with 1 week of lower abdominal discomfort.  According to patient, for the last week he has had left lower quad abdominal pain, chills, fever of 101.5 last night, nausea, vomiting, and fatigue.  He said that this feels similar to when he had diverticulitis in the past.  He denies any trauma.  He reports no diarrhea or rectal bleeding but has had some constipation.  Denies any urinary symptoms.  Denies any rashes.  Denies any flank pain but does report some pain in his back.  Denies any congestion, cough, or URI symptoms.  On exam, he does have tenderness to left lower quadrant.  Bowel sounds were appreciated.  Flanks nontender.  Back not significantly tender.  Intact pulses in extremities.  Patient resting comfortably but was warm to the touch.  Patient was found to be febrile.  CT imaging in triage revealed he does have acute diverticulitis and there is some evidence of possible early intramural abscess.  I spoke to Dr. Kieth Brightly with general surgery who felt this was appropriate for medicine admission for IV antibiotics and they will monitor.  Patient agrees with IV antibiotics and medicine for pain management, nausea management, and some fluids.  Patient admitted to medicine for further management.         Final Clinical Impression(s) / ED Diagnoses Final diagnoses:  Left lower quadrant abdominal pain  Diverticulitis    Clinical Impression: 1. Left lower quadrant abdominal pain   2. Diverticulitis     Disposition: Admit  This note was prepared with assistance of Dragon voice  recognition software. Occasional wrong-word or sound-a-like substitutions may have occurred due to the inherent limitations of voice recognition software.      Tine Mabee, Gwenyth Allegra, MD 08/05/22 [phone]

## 2022-08-05 NOTE — ED Provider Triage Note (Signed)
Emergency Medicine Provider Triage Evaluation Note  Roger Jennings , a 46 y.o. male  was evaluated in triage.  Pt complains of lower abdominal pain x1 week.  Patient has history of diverticulitis and states it feels similar to his past episodes.  He states he has not had a normal bowel movement in 1 week.  Admits to 1 episode of emesis yesterday however, he relates it to this and getting caught in his throat. No urinary or penile symptoms.  Review of Systems  Positive: Abdominal pain Negative: fever  Physical Exam  BP 130/86 (BP Location: Right Arm)   Pulse 94   Temp 99 F (37.2 C) (Oral)   Resp 17   Ht '5\' 10"'$  (1.778 m)   Wt 113.4 kg   SpO2 98%   BMI 35.87 kg/m  Gen:   Awake, no distress   Resp:  Normal effort  MSK:   Moves extremities without difficulty  Other:  Lower abdominal tenderness  Medical Decision Making  Medically screening exam initiated at 9:18 AM.  Appropriate orders placed.  Elvera Lennox was informed that the remainder of the evaluation will be completed by another provider, this initial triage assessment does not replace that evaluation, and the importance of remaining in the ED until their evaluation is complete.  Labs CT abdomen   Karie Kirks 08/05/22 1975

## 2022-08-06 DIAGNOSIS — N179 Acute kidney failure, unspecified: Secondary | ICD-10-CM

## 2022-08-06 LAB — CBC
HCT: 40.6 % (ref 39.0–52.0)
Hemoglobin: 14.2 g/dL (ref 13.0–17.0)
MCH: 31.1 pg (ref 26.0–34.0)
MCHC: 35 g/dL (ref 30.0–36.0)
MCV: 89 fL (ref 80.0–100.0)
Platelets: 167 10*3/uL (ref 150–400)
RBC: 4.56 MIL/uL (ref 4.22–5.81)
RDW: 13.1 % (ref 11.5–15.5)
WBC: 10.8 10*3/uL — ABNORMAL HIGH (ref 4.0–10.5)
nRBC: 0 % (ref 0.0–0.2)

## 2022-08-06 LAB — BASIC METABOLIC PANEL
Anion gap: 9 (ref 5–15)
BUN: 16 mg/dL (ref 6–20)
CO2: 23 mmol/L (ref 22–32)
Calcium: 8.7 mg/dL — ABNORMAL LOW (ref 8.9–10.3)
Chloride: 102 mmol/L (ref 98–111)
Creatinine, Ser: 1.26 mg/dL — ABNORMAL HIGH (ref 0.61–1.24)
GFR, Estimated: >60 mL/min (ref >60)
Glucose, Bld: 94 mg/dL (ref 70–99)
Potassium: 3.5 mmol/L (ref 3.5–5.1)
Sodium: 134 mmol/L — ABNORMAL LOW (ref 135–145)

## 2022-08-06 LAB — HIV ANTIBODY (ROUTINE TESTING W REFLEX): HIV Screen 4th Generation wRfx: NONREACTIVE

## 2022-08-06 MED ORDER — PIPERACILLIN-TAZOBACTAM 3.375 G IVPB
3.3750 g | Freq: Three times a day (TID) | INTRAVENOUS | Status: DC
  Administered 2022-08-06 – 2022-08-08 (×7): 3.375 g via INTRAVENOUS
  Filled 2022-08-06 (×7): qty 50

## 2022-08-06 NOTE — Progress Notes (Addendum)
PROGRESS NOTE    Roger Jennings  QBH:[id number] DOB: 02-15-1976 DOA: 08/05/2022 PCP: Patient, No Pcp Per   Brief Narrative:  HPI: Roger Jennings is a 46 y.o. male with medical history significant of Gout, Diverticulitis who presents with complains of abdominal pain with associated nausea of 1 week duration.   The patient reports that he started having lower left quadrant abdominal discomfort approximately 1 week prior and was taking pain medications with limited effect. The abdominal pain was associated with night sweats, chills, and feeling feverish for the past several nights. Temp night prior to presentation was 101.51F. He has also had significantly diminished appetite and low energy and has been only able to eat small amounts. He reports constipation as well for approximately the last week. The day of presentation, he had worsening nausea and given his fever, came in for evaluation.   Of note, he has prior history of diverticulitis and reports similar symptoms approximately ever year to 1.5 yrs that he typically treats with pain medications. He has not seen GI for a few years.   In the ED, he was found to be febrile to 100.34F and hemodynamically stable on room air. CT A/P demonstrated proximal sigmoid diverticultis/colitis with phlegmon. Surgery was consulted and recommend conservative management. He was given 1 dose of IV zosyn and admitted for further IV antibiotics.  Assessment & Plan:   Principal Problem:   Diverticulitis Active Problems:   AKI (acute kidney injury) ([place])  Acute diverticulitis with phlegmon: Patient is improving.  Pain is improving.  No nausea.  He is on clear liquid diet and tolerating well.  General surgery has seen him, no indication of surgery.  They recommend antibiotics which we will continue.  They also recommend continue clear liquid diet.  Management per general surgery.  AKI: Likely due to poor p.o. intake.  Improving.  Continue gentle hydration.  DVT  prophylaxis: enoxaparin (LOVENOX) injection 40 mg Start: 08/05/22 2030   Code Status: Full Code  Family Communication:  None present at bedside.  Plan of care discussed with patient in length and he/she verbalized understanding and agreed with it.  Status is: Inpatient Remains inpatient appropriate because: Needs IV antibiotics and still on clear liquid diet.   Estimated body mass index is 35.87 kg/m as calculated from the following:   Height as of this encounter: '5\' 10"'$  (1.778 m).   Weight as of this encounter: 113.4 kg.    Nutritional Assessment: Body mass index is 35.87 kg/m.Marland Kitchen Seen by dietician.  I agree with the assessment and plan as outlined below: Nutrition Status:        . Skin Assessment: I have examined the patient's skin and I agree with the wound assessment as performed by the wound care RN as outlined below:    Consultants:  General surgery  Procedures:  None  Antimicrobials:  Anti-infectives (From admission, onward)    Start     Dose/Rate Route Frequency Ordered Stop   08/06/22 2200  piperacillin-tazobactam (ZOSYN) IVPB 3.375 g  Status:  Discontinued       Note to Pharmacy: Should have received first Zosyn dose, this is continuation   3.375 g 12.5 mL/hr over 240 Minutes Intravenous Every 8 hours 08/05/22 2044 08/06/22 0226   08/06/22 0315  piperacillin-tazobactam (ZOSYN) IVPB 3.375 g       Note to Pharmacy: Should have received first Zosyn dose, this is continuation   3.375 g 12.5 mL/hr over 240 Minutes Intravenous Every 8 hours 08/06/22 0226  08/13/22 0559   08/05/22 1945  piperacillin-tazobactam (ZOSYN) IVPB 3.375 g  Status:  Discontinued        3.375 g 100 mL/hr over 30 Minutes Intravenous  Once 08/05/22 1931 08/05/22 2059         Subjective: Patient seen and examined.  Currently improved.  No other complaint.  Objective: Vitals:   08/05/22 2213 08/06/22 0013 08/06/22 0620 08/06/22 0743  BP: 137/81 118/71 119/67 112/67  Pulse: 98 92 88 76   Resp: '17 17 17 18  '$ Temp: (!) 100.6 F (38.1 C) 99.8 F (37.7 C) 98.4 F (36.9 C) 99.2 F (37.3 C)  TempSrc: Oral Oral Oral Oral  SpO2: 99% 97% 96% 97%  Weight:      Height:        Intake/Output Summary (Last 24 hours) at 08/06/2022 1043 Last data filed at 08/06/2022 0324 Gross per 24 hour  Intake 392.3 ml  Output --  Net 392.3 ml   Filed Weights   08/05/22 0909  Weight: 113.4 kg    Examination:  General exam: Appears calm and comfortable  Respiratory system: Clear to auscultation. Respiratory effort normal. Cardiovascular system: S1 & S2 heard, RRR. No JVD, murmurs, rubs, gallops or clicks. No pedal edema. Gastrointestinal system: Abdomen is nondistended, soft and mild left lower quadrant tenderness. No organomegaly or masses felt. Normal bowel sounds heard. Central nervous system: Alert and oriented. No focal neurological deficits. Extremities: Symmetric 5 x 5 power. Skin: No rashes, lesions or ulcers Psychiatry: Judgement and insight appear normal. Mood & affect appropriate.    Data Reviewed: I have personally reviewed following labs and imaging studies  CBC: Recent Labs  Lab 08/05/22 0920 08/06/22 0157  WBC 11.9* 10.8*  HGB 16.3 14.2  HCT 47.5 40.6  MCV 89.1 89.0  PLT 168 782   Basic Metabolic Panel: Recent Labs  Lab 08/05/22 0920 08/06/22 0157  NA 138 134*  K 4.3 3.5  CL 101 102  CO2 23 23  GLUCOSE 105* 94  BUN 18 16  CREATININE 1.42* 1.26*  CALCIUM 9.5 8.7*   GFR: Estimated Creatinine Clearance: 93.4 mL/min (A) (by C-G formula based on SCr of 1.26 mg/dL (H)). Liver Function Tests: Recent Labs  Lab 08/05/22 0920  AST 37  ALT 45*  ALKPHOS 67  BILITOT 1.3*  PROT 8.0  ALBUMIN 3.9   Recent Labs  Lab 08/05/22 0920  LIPASE 28   No results for input(s): "AMMONIA" in the last 168 hours. Coagulation Profile: No results for input(s): "INR", "PROTIME" in the last 168 hours. Cardiac Enzymes: No results for input(s): "CKTOTAL", "CKMB",  "CKMBINDEX", "TROPONINI" in the last 168 hours. BNP (last 3 results) No results for input(s): "PROBNP" in the last 8760 hours. HbA1C: No results for input(s): "HGBA1C" in the last 72 hours. CBG: No results for input(s): "GLUCAP" in the last 168 hours. Lipid Profile: No results for input(s): "CHOL", "HDL", "LDLCALC", "TRIG", "CHOLHDL", "LDLDIRECT" in the last 72 hours. Thyroid Function Tests: No results for input(s): "TSH", "T4TOTAL", "FREET4", "T3FREE", "THYROIDAB" in the last 72 hours. Anemia Panel: No results for input(s): "VITAMINB12", "FOLATE", "FERRITIN", "TIBC", "IRON", "RETICCTPCT" in the last 72 hours. Sepsis Labs: Recent Labs  Lab 08/05/22 2121  LATICACIDVEN 1.2    Recent Results (from the past 240 hour(s))  Blood culture (routine x 2)     Status: None (Preliminary result)   Collection Time: 08/05/22  7:37 PM   Specimen: BLOOD  Result Value Ref Range Status   Specimen Description BLOOD SITE NOT  SPECIFIED  Final   Special Requests   Final    BOTTLES DRAWN AEROBIC AND ANAEROBIC Blood Culture results may not be optimal due to an excessive volume of blood received in culture bottles   Culture   Final    NO GROWTH < 12 HOURS Performed at [facility] [address], [place], [place] [address]    Report Status PENDING  Incomplete  Blood culture (routine x 2)     Status: None (Preliminary result)   Collection Time: 08/05/22  9:21 PM   Specimen: BLOOD  Result Value Ref Range Status   Specimen Description BLOOD SITE NOT SPECIFIED  Final   Special Requests   Final    BOTTLES DRAWN AEROBIC AND ANAEROBIC Blood Culture adequate volume   Culture   Final    NO GROWTH < 12 HOURS Performed at [facility], [place] [address], [place], [place] [address]    Report Status PENDING  Incomplete     Radiology Studies: CT ABDOMEN PELVIS W CONTRAST  Result Date: 08/05/2022 CLINICAL DATA:  Left lower quadrant pain, history diverticulitis EXAM: CT ABDOMEN AND PELVIS WITH  CONTRAST TECHNIQUE: Multidetector CT imaging of the abdomen and pelvis was performed using the standard protocol following bolus administration of intravenous contrast. RADIATION DOSE REDUCTION: This exam was performed according to the departmental dose-optimization program which includes automated exposure control, adjustment of the mA and/or kV according to patient size and/or use of iterative reconstruction technique. CONTRAST:  61m OMNIPAQUE IOHEXOL 350 MG/ML SOLN COMPARISON:  CT 05/15/2019 FINDINGS: Lower chest: No pleural or pericardial effusion. Linear scarring or atelectasis in the lung bases, improved since previous. Hepatobiliary: No focal liver abnormality is seen. No gallstones, gallbladder wall thickening, or biliary dilatation. Pancreas: Unremarkable. No pancreatic ductal dilatation or surrounding inflammatory changes. Spleen: Normal in size without focal abnormality. Adrenals/Urinary Tract: No adrenal mass. 2 mm left lower pole renal calculus. Symmetric renal enhancement without focal lesion or hydronephrosis. Urinary bladder is incompletely distended. Stomach/Bowel: Stomach decompressed. Small bowel nondistended, unremarkable. Normal appendix. The colon is incompletely distended. There is marked wall thickening and in the proximal sigmoid colon with effacement of the lumen and moderate regional inflammatory/edematous changes, new since previous. Low-attenuation poorly marginated regions in the wall may represent developing intramural abscesses, but no drainable components. A few small regional diverticula identified. Vascular/Lymphatic: No significant vascular findings are present. No enlarged abdominal or pelvic lymph nodes. Reproductive: Prostate is unremarkable. Other: No ascites.  No free air. Musculoskeletal: Benign bone island in the left femoral neck, seen back to 11/07/2007. Mild narrowing of interspaces L4-5 and L5-S1. No acute findings. IMPRESSION: 1. Moderate sigmoid diverticulitis, with  possible developing intramural abscesses but no drainable components. Consider follow-up to confirm appropriate resolution and exclude mucosal lesion. 2. Left nephrolithiasis without hydronephrosis. Electronically Signed   By: DLucrezia EuropeM.D.   On: 08/05/2022 12:35    Scheduled Meds:  enoxaparin (LOVENOX) injection  40 mg Subcutaneous Q24H   sodium chloride flush  3 mL Intravenous Q12H   Continuous Infusions:  0.45 % NaCl with KCl 20 mEq / L 50 mL/hr at 08/06/22 0324   piperacillin-tazobactam (ZOSYN)  IV 12.5 mL/hr at 08/06/22 0324     LOS: 1 day   RDarliss Cheney MD [facility]  08/06/2022, 10:43 AM   *Please note that this is a verbal dictation therefore any spelling or grammatical errors are due to the "DDaytonOne" system interpretation.  Please page via AViloniaand do not message  via secure chat for urgent patient care matters. Secure chat can be used for non urgent patient care matters.  How to contact the [facility] Attending or Consulting provider [place] or covering provider during after hours [place], for this patient?  Check the care team in [facility] and look for a) attending/consulting [facility] provider listed and b) the [facility] team listed. Page or secure chat [phone]. Log into [email or web address] and use [place]'s universal password to access. If you do not have the password, please contact the hospital operator. Locate the [facility] provider you are looking for under [facility] and page to a number that you can be directly reached. If you still have difficulty reaching the provider, please page the [facility] (Director on Call) for the Hospitalists listed on amion for assistance.

## 2022-08-06 NOTE — Progress Notes (Signed)
Subjective/Chief Complaint: Patient resting comfortably LLQ pain improved Small amount of diarrhea - large amount of flatus Afebrile Tolerating clear liquids   Objective: Vital signs in last 24 hours: Temp:  [98.4 F (36.9 C)-100.6 F (38.1 C)] 99.2 F (37.3 C) (10/28 0743) Pulse Rate:  [76-98] 76 (10/28 0743) Resp:  [17-18] 18 (10/28 0743) BP: (112-147)/(67-93) 112/67 (10/28 0743) SpO2:  [95 %-99 %] 97 % (10/28 0743) Weight:  [113.4 kg] 113.4 kg (10/27 0909) Last BM Date : 08/06/22  Intake/Output from previous day: 10/27 0701 - 10/28 0700 In: 392.3 [P.O.:240; I.V.:152.1; IV Piggyback:0.2] Out: -  Intake/Output this shift: No intake/output data recorded.  General appearance: alert, cooperative, and no distress GI: Slight LLQ tenderness; no peritonitis  Lab Results:  Recent Labs    08/05/22 0920 08/06/22 0157  WBC 11.9* 10.8*  HGB 16.3 14.2  HCT 47.5 40.6  PLT 168 167   BMET Recent Labs    08/05/22 0920 08/06/22 0157  NA 138 134*  K 4.3 3.5  CL 101 102  CO2 23 23  GLUCOSE 105* 94  BUN 18 16  CREATININE 1.42* 1.26*  CALCIUM 9.5 8.7*   PT/INR No results for input(s): "LABPROT", "INR" in the last 72 hours. ABG No results for input(s): "PHART", "HCO3" in the last 72 hours.  Invalid input(s): "PCO2", "PO2"  Studies/Results: CT ABDOMEN PELVIS W CONTRAST  Result Date: 08/05/2022 CLINICAL DATA:  Left lower quadrant pain, history diverticulitis EXAM: CT ABDOMEN AND PELVIS WITH CONTRAST TECHNIQUE: Multidetector CT imaging of the abdomen and pelvis was performed using the standard protocol following bolus administration of intravenous contrast. RADIATION DOSE REDUCTION: This exam was performed according to the departmental dose-optimization program which includes automated exposure control, adjustment of the mA and/or kV according to patient size and/or use of iterative reconstruction technique. CONTRAST:  49m OMNIPAQUE IOHEXOL 350 MG/ML SOLN COMPARISON:   CT 05/15/2019 FINDINGS: Lower chest: No pleural or pericardial effusion. Linear scarring or atelectasis in the lung bases, improved since previous. Hepatobiliary: No focal liver abnormality is seen. No gallstones, gallbladder wall thickening, or biliary dilatation. Pancreas: Unremarkable. No pancreatic ductal dilatation or surrounding inflammatory changes. Spleen: Normal in size without focal abnormality. Adrenals/Urinary Tract: No adrenal mass. 2 mm left lower pole renal calculus. Symmetric renal enhancement without focal lesion or hydronephrosis. Urinary bladder is incompletely distended. Stomach/Bowel: Stomach decompressed. Small bowel nondistended, unremarkable. Normal appendix. The colon is incompletely distended. There is marked wall thickening and in the proximal sigmoid colon with effacement of the lumen and moderate regional inflammatory/edematous changes, new since previous. Low-attenuation poorly marginated regions in the wall may represent developing intramural abscesses, but no drainable components. A few small regional diverticula identified. Vascular/Lymphatic: No significant vascular findings are present. No enlarged abdominal or pelvic lymph nodes. Reproductive: Prostate is unremarkable. Other: No ascites.  No free air. Musculoskeletal: Benign bone island in the left femoral neck, seen back to 11/07/2007. Mild narrowing of interspaces L4-5 and L5-S1. No acute findings. IMPRESSION: 1. Moderate sigmoid diverticulitis, with possible developing intramural abscesses but no drainable components. Consider follow-up to confirm appropriate resolution and exclude mucosal lesion. 2. Left nephrolithiasis without hydronephrosis. Electronically Signed   By: DLucrezia EuropeM.D.   On: 08/05/2022 12:35    Anti-infectives: Anti-infectives (From admission, onward)    Start     Dose/Rate Route Frequency Ordered Stop   08/06/22 2200  piperacillin-tazobactam (ZOSYN) IVPB 3.375 g  Status:  Discontinued       Note to  Pharmacy: Should have received  first Zosyn dose, this is continuation   3.375 g 12.5 mL/hr over 240 Minutes Intravenous Every 8 hours 08/05/22 2044 08/06/22 0226   08/06/22 0315  piperacillin-tazobactam (ZOSYN) IVPB 3.375 g       Note to Pharmacy: Should have received first Zosyn dose, this is continuation   3.375 g 12.5 mL/hr over 240 Minutes Intravenous Every 8 hours 08/06/22 0226 08/13/22 0559   08/05/22 1945  piperacillin-tazobactam (ZOSYN) IVPB 3.375 g  Status:  Discontinued        3.375 g 100 mL/hr over 30 Minutes Intravenous  Once 08/05/22 1931 08/05/22 2059       Assessment/Plan: Sigmoid diverticulitis with developing intramural phlegmon No indications for urgent surgery Continue clear liquids - do not advance yet Continue Zosyn Ambulate as tolerated.   LOS: 1 day    Maia Petties 08/06/2022

## 2022-08-07 LAB — BASIC METABOLIC PANEL
Anion gap: 12 (ref 5–15)
BUN: 12 mg/dL (ref 6–20)
CO2: 24 mmol/L (ref 22–32)
Calcium: 9 mg/dL (ref 8.9–10.3)
Chloride: 100 mmol/L (ref 98–111)
Creatinine, Ser: 1.3 mg/dL — ABNORMAL HIGH (ref 0.61–1.24)
GFR, Estimated: >60 mL/min (ref >60)
Glucose, Bld: 147 mg/dL — ABNORMAL HIGH (ref 70–99)
Potassium: 5.2 mmol/L — ABNORMAL HIGH (ref 3.5–5.1)
Sodium: 136 mmol/L (ref 135–145)

## 2022-08-07 MED ORDER — ONDANSETRON HCL 4 MG/2ML IJ SOLN: 4.0000 mg | Freq: Four times a day (QID) | INTRAMUSCULAR | Status: DC | PRN

## 2022-08-07 MED ORDER — PROCHLORPERAZINE EDISYLATE 10 MG/2ML IJ SOLN: 5.0000 mg | INTRAMUSCULAR | Status: DC | PRN

## 2022-08-07 MED ORDER — MAGIC MOUTHWASH: 15.0000 mL | Freq: Four times a day (QID) | ORAL | Status: DC | PRN

## 2022-08-07 MED ORDER — DIPHENHYDRAMINE HCL 50 MG/ML IJ SOLN: 12.5000 mg | Freq: Four times a day (QID) | INTRAMUSCULAR | Status: DC | PRN

## 2022-08-07 MED ORDER — METHOCARBAMOL 1000 MG/10ML IJ SOLN: 1000.0000 mg | Freq: Four times a day (QID) | INTRAVENOUS | Status: DC | PRN

## 2022-08-07 MED ORDER — PHENOL 1.4 % MT LIQD: 2.0000 | OROMUCOSAL | Status: DC | PRN

## 2022-08-07 MED ORDER — LACTATED RINGERS IV BOLUS: 1000.0000 mL | Freq: Three times a day (TID) | INTRAVENOUS | Status: DC | PRN

## 2022-08-07 MED ORDER — MENTHOL 3 MG MT LOZG: 1.0000 | LOZENGE | OROMUCOSAL | Status: DC | PRN

## 2022-08-07 MED ORDER — SIMETHICONE 40 MG/0.6ML PO SUSP: 80.0000 mg | Freq: Four times a day (QID) | ORAL | Status: DC | PRN

## 2022-08-07 MED ORDER — SODIUM CHLORIDE 0.9 % IV SOLN: 8.0000 mg | Freq: Four times a day (QID) | INTRAVENOUS | Status: DC | PRN

## 2022-08-07 MED ORDER — LIP MEDEX EX OINT
TOPICAL_OINTMENT | Freq: Two times a day (BID) | CUTANEOUS | Status: DC
  Administered 2022-08-07 (×2): 75 via TOPICAL
  Filled 2022-08-07: qty 7

## 2022-08-07 MED ORDER — ALUM & MAG HYDROXIDE-SIMETH 200-200-20 MG/5ML PO SUSP: 30.0000 mL | Freq: Four times a day (QID) | ORAL | Status: DC | PRN

## 2022-08-07 NOTE — Progress Notes (Addendum)
PROGRESS NOTE    STEPHENSON CICHY  CWC:[id number] DOB: [birth year] DOA: 08/05/2022 PCP: Patient, No Pcp Per   Brief Narrative:  HPI: ESHAWN COOR is a 46 y.o. male with medical history significant of Gout, Diverticulitis who presents with complains of abdominal pain with associated nausea of 1 week duration.   The patient reports that he started having lower left quadrant abdominal discomfort approximately 1 week prior and was taking pain medications with limited effect. The abdominal pain was associated with night sweats, chills, and feeling feverish for the past several nights. Temp night prior to presentation was 101.42F. He has also had significantly diminished appetite and low energy and has been only able to eat small amounts. He reports constipation as well for approximately the last week. The day of presentation, he had worsening nausea and given his fever, came in for evaluation.   Of note, he has prior history of diverticulitis and reports similar symptoms approximately ever year to 1.5 yrs that he typically treats with pain medications. He has not seen GI for a few years.   In the ED, he was found to be febrile to 100.85F and hemodynamically stable on room air. CT A/P demonstrated proximal sigmoid diverticultis/colitis with phlegmon. Surgery was consulted and recommend conservative management. He was given 1 dose of IV zosyn and admitted for further IV antibiotics.  Assessment & Plan:   Principal Problem:   Diverticulitis Active Problems:   AKI (acute kidney injury) ([place])  Acute diverticulitis with phlegmon: Patient is improving.  Pain is improving.  No nausea.  He is on clear liquid diet and tolerating well.  No tenderness on my exam.  Discussed with Dr. Prince Solian, will advance to full liquid diet.  They will see him later.  Hyperkalemia: Very mild, 5.2.  Will repeat in the morning.  AKI: Likely due to poor p.o. intake.  Was improving yesterday.  Checking BMP.  DVT prophylaxis:  enoxaparin (LOVENOX) injection 40 mg Start: 08/05/22 2030   Code Status: Full Code  Family Communication:  None present at bedside.  Plan of care discussed with patient in length and he/she verbalized understanding and agreed with it.  Status is: Inpatient Remains inpatient appropriate because: Waiting for surgery to formulate plans.   Estimated body mass index is 35.87 kg/m as calculated from the following:   Height as of this encounter: '5\' 10"'$  (1.778 m).   Weight as of this encounter: 113.4 kg.    Nutritional Assessment: Body mass index is 35.87 kg/m.Marland Kitchen Seen by dietician.  I agree with the assessment and plan as outlined below: Nutrition Status:        . Skin Assessment: I have examined the patient's skin and I agree with the wound assessment as performed by the wound care RN as outlined below:    Consultants:  General surgery  Procedures:  None  Antimicrobials:  Anti-infectives (From admission, onward)    Start     Dose/Rate Route Frequency Ordered Stop   08/06/22 2200  piperacillin-tazobactam (ZOSYN) IVPB 3.375 g  Status:  Discontinued       Note to Pharmacy: Should have received first Zosyn dose, this is continuation   3.375 g 12.5 mL/hr over 240 Minutes Intravenous Every 8 hours 08/05/22 2044 08/06/22 0226   08/06/22 0315  piperacillin-tazobactam (ZOSYN) IVPB 3.375 g       Note to Pharmacy: Should have received first Zosyn dose, this is continuation   3.375 g 12.5 mL/hr over 240 Minutes Intravenous Every 8 hours 08/06/22  3244 08/13/22 0559   08/05/22 1945  piperacillin-tazobactam (ZOSYN) IVPB 3.375 g  Status:  Discontinued        3.375 g 100 mL/hr over 30 Minutes Intravenous  Once 08/05/22 1931 08/05/22 2059         Subjective:  Seen and examined.  He said that he feels better.  He has mild tenderness with walking but no other complaint.  Tolerating clear liquid diet.  Objective: Vitals:   08/06/22 1751 08/06/22 2056 08/07/22 0501 08/07/22 0744  BP:  116/66 117/75 115/68 110/68  Pulse: 81 89 77 76  Resp: '18 16 16 17  '$ Temp: 99.1 F (37.3 C) (!) 100.7 F (38.2 C) 98.8 F (37.1 C) 97.8 F (36.6 C)  TempSrc: Oral Oral Oral Oral  SpO2: 97% 97% 95% 96%  Weight:      Height:       No intake or output data in the 24 hours ending 08/07/22 1046  Filed Weights   08/05/22 0909  Weight: 113.4 kg    Examination:  General exam: Appears calm and comfortable  Respiratory system: Clear to auscultation. Respiratory effort normal. Cardiovascular system: S1 & S2 heard, RRR. No JVD, murmurs, rubs, gallops or clicks. No pedal edema. Gastrointestinal system: Abdomen is nondistended, soft and nontender. No organomegaly or masses felt. Normal bowel sounds heard. Central nervous system: Alert and oriented. No focal neurological deficits. Extremities: Symmetric 5 x 5 power. Skin: No rashes, lesions or ulcers.  Psychiatry: Judgement and insight appear normal. Mood & affect appropriate.   Data Reviewed: I have personally reviewed following labs and imaging studies  CBC: Recent Labs  Lab 08/05/22 0920 08/06/22 0157  WBC 11.9* 10.8*  HGB 16.3 14.2  HCT 47.5 40.6  MCV 89.1 89.0  PLT 168 010    Basic Metabolic Panel: Recent Labs  Lab 08/05/22 0920 08/06/22 0157  NA 138 134*  K 4.3 3.5  CL 101 102  CO2 23 23  GLUCOSE 105* 94  BUN 18 16  CREATININE 1.42* 1.26*  CALCIUM 9.5 8.7*    GFR: Estimated Creatinine Clearance: 93.4 mL/min (A) (by C-G formula based on SCr of 1.26 mg/dL (H)). Liver Function Tests: Recent Labs  Lab 08/05/22 0920  AST 37  ALT 45*  ALKPHOS 67  BILITOT 1.3*  PROT 8.0  ALBUMIN 3.9    Recent Labs  Lab 08/05/22 0920  LIPASE 28    No results for input(s): "AMMONIA" in the last 168 hours. Coagulation Profile: No results for input(s): "INR", "PROTIME" in the last 168 hours. Cardiac Enzymes: No results for input(s): "CKTOTAL", "CKMB", "CKMBINDEX", "TROPONINI" in the last 168 hours. BNP (last 3  results) No results for input(s): "PROBNP" in the last 8760 hours. HbA1C: No results for input(s): "HGBA1C" in the last 72 hours. CBG: No results for input(s): "GLUCAP" in the last 168 hours. Lipid Profile: No results for input(s): "CHOL", "HDL", "LDLCALC", "TRIG", "CHOLHDL", "LDLDIRECT" in the last 72 hours. Thyroid Function Tests: No results for input(s): "TSH", "T4TOTAL", "FREET4", "T3FREE", "THYROIDAB" in the last 72 hours. Anemia Panel: No results for input(s): "VITAMINB12", "FOLATE", "FERRITIN", "TIBC", "IRON", "RETICCTPCT" in the last 72 hours. Sepsis Labs: Recent Labs  Lab 08/05/22 2121  LATICACIDVEN 1.2     Recent Results (from the past 240 hour(s))  Blood culture (routine x 2)     Status: None (Preliminary result)   Collection Time: 08/05/22  7:37 PM   Specimen: BLOOD  Result Value Ref Range Status   Specimen Description BLOOD SITE NOT SPECIFIED  Final   Special Requests   Final    BOTTLES DRAWN AEROBIC AND ANAEROBIC Blood Culture results may not be optimal due to an excessive volume of blood received in culture bottles   Culture   Final    NO GROWTH < 12 HOURS Performed at [facility] [address], [place], [place] [address]    Report Status PENDING  Incomplete  Blood culture (routine x 2)     Status: None (Preliminary result)   Collection Time: 08/05/22  9:21 PM   Specimen: BLOOD  Result Value Ref Range Status   Specimen Description BLOOD SITE NOT SPECIFIED  Final   Special Requests   Final    BOTTLES DRAWN AEROBIC AND ANAEROBIC Blood Culture adequate volume   Culture   Final    NO GROWTH < 12 HOURS Performed at [facility], [place] [address] [place],  [address]    Report Status PENDING  Incomplete     Radiology Studies: CT ABDOMEN PELVIS W CONTRAST  Result Date: 08/05/2022 CLINICAL DATA:  Left lower quadrant pain, history diverticulitis EXAM: CT ABDOMEN AND PELVIS WITH CONTRAST TECHNIQUE: Multidetector CT imaging of the abdomen and  pelvis was performed using the standard protocol following bolus administration of intravenous contrast. RADIATION DOSE REDUCTION: This exam was performed according to the departmental dose-optimization program which includes automated exposure control, adjustment of the mA and/or kV according to patient size and/or use of iterative reconstruction technique. CONTRAST:  44m OMNIPAQUE IOHEXOL 350 MG/ML SOLN COMPARISON:  CT 05/15/2019 FINDINGS: Lower chest: No pleural or pericardial effusion. Linear scarring or atelectasis in the lung bases, improved since previous. Hepatobiliary: No focal liver abnormality is seen. No gallstones, gallbladder wall thickening, or biliary dilatation. Pancreas: Unremarkable. No pancreatic ductal dilatation or surrounding inflammatory changes. Spleen: Normal in size without focal abnormality. Adrenals/Urinary Tract: No adrenal mass. 2 mm left lower pole renal calculus. Symmetric renal enhancement without focal lesion or hydronephrosis. Urinary bladder is incompletely distended. Stomach/Bowel: Stomach decompressed. Small bowel nondistended, unremarkable. Normal appendix. The colon is incompletely distended. There is marked wall thickening and in the proximal sigmoid colon with effacement of the lumen and moderate regional inflammatory/edematous changes, new since previous. Low-attenuation poorly marginated regions in the wall may represent developing intramural abscesses, but no drainable components. A few small regional diverticula identified. Vascular/Lymphatic: No significant vascular findings are present. No enlarged abdominal or pelvic lymph nodes. Reproductive: Prostate is unremarkable. Other: No ascites.  No free air. Musculoskeletal: Benign bone island in the left femoral neck, seen back to 11/07/2007. Mild narrowing of interspaces L4-5 and L5-S1. No acute findings. IMPRESSION: 1. Moderate sigmoid diverticulitis, with possible developing intramural abscesses but no drainable  components. Consider follow-up to confirm appropriate resolution and exclude mucosal lesion. 2. Left nephrolithiasis without hydronephrosis. Electronically Signed   By: DLucrezia EuropeM.D.   On: 08/05/2022 12:35    Scheduled Meds:  enoxaparin (LOVENOX) injection  40 mg Subcutaneous Q24H   sodium chloride flush  3 mL Intravenous Q12H   Continuous Infusions:  0.45 % NaCl with KCl 20 mEq / L 50 mL/hr at 08/06/22 2126   piperacillin-tazobactam (ZOSYN)  IV 3.375 g (08/07/22 0516)     LOS: 2 days   RDarliss Cheney MD [facility]  08/07/2022, 10:46 AM   *Please note that this is a verbal dictation therefore any spelling or grammatical errors are due to the "[employer]" system interpretation.  Please page via [email or web address] do not message via secure chat  for urgent patient care matters. Secure chat can be used for non urgent patient care matters.  How to contact the [facility] Attending or Consulting provider [place] or covering provider during after hours [place], for this patient?  Check the care team in [facility] and look for a) attending/consulting [facility] provider listed and b) the [facility] team listed. Page or secure chat [phone]. Log into [email or web address] and use 's universal password to access. If you do not have the password, please contact the hospital operator. Locate the [facility] provider you are looking for under [facility] and page to a number that you can be directly reached. If you still have difficulty reaching the provider, please page the [facility] (Director on Call) for the Hospitalists listed on amion for assistance.

## 2022-08-07 NOTE — Progress Notes (Signed)
Roger Jennings [id number] 1976-04-14  CARE TEAM:  PCP: Patient, No Pcp Per  Outpatient Care Team: Patient Care Team: Patient, No Pcp Per as PCP - General (General Practice)  Inpatient Treatment Team: Treatment Team: Attending Provider: Darliss Cheney, MD; Rounding Team: Edison Pace, Md, MD; Rounding Team: Kelvin Cellar, MD; Pharmacist: Jerilynn Birkenhead, [facility]; Utilization Review: Antionette Char, RN; Licensed Practical Nurse: Corliss Marcus, LPN; Case Manager: Bartholomew Crews, RN; Social Worker: Land, Baruch Gouty M, [place]   Problem List:   Principal Problem:   Diverticulitis Active Problems:   AKI (acute kidney injury) ([place])      * No surgery found *      Assessment  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  Clinically improving  [facility] Stay = 2 days)  Plan:  Continue IV antibiotics.  Agree with piperacillin/tazobactam given complicated tach.  Ileus seems to be resolving.  Will start with liquids and advance gradually.  Had a comprehensive discussion about pathophysiology of diverticulosis and diverticulitis.  With his numerous repeated attacks and now with a complicated tach, I think he could benefit from elective resection to get rid of this problem area to avoid future attacks.  This is the worst when he has had any is very open to the idea.  He worries about the cost but worries about the need for emergency surgery colostomy which could happen given his young age and repeated attacks.  Patient had colonoscopy bilateral lower gastroenterology in 2016 with Dr. Deatra Ina who is no longer with the group.  Discussed with one of his colleagues.  See if we can perhaps consider an elective colonoscopy and robotic colectomy in about 6-8 weeks after discharge to minimize the risk of conversion, leak, recurrence.  -VTE prophylaxis- SCDs, etc  -mobilize as tolerated to help recovery  Disposition: To be determined.  Hopefully transition  to oral antibiotics & d/c 10/31.       I reviewed nursing notes, hospitalist notes, last 24 h vitals and pain scores, last 48 h intake and output, last 24 h labs and trends, and last 24 h imaging results. I have reviewed this patient's available data, including medical history, events of note, test results, etc as part of my evaluation.  A significant portion of that time was spent in counseling.  Care during the described time interval was provided by me.  This care required moderate level of medical decision making.  08/07/2022    Subjective: (Chief complaint)  Feeling better overall.  Passing some gas.  Want to try more than just liquids.  Objective:  Vital signs:  Vitals:   08/06/22 1751 08/06/22 2056 08/07/22 0501 08/07/22 0744  BP: 116/66 117/75 115/68 110/68  Pulse: 81 89 77 76  Resp: '18 16 16 17  '$ Temp: 99.1 F (37.3 C) (!) 100.7 F (38.2 C) 98.8 F (37.1 C) 97.8 F (36.6 C)  TempSrc: Oral Oral Oral Oral  SpO2: 97% 97% 95% 96%  Weight:      Height:        Last BM Date : 08/06/22  Intake/Output   Yesterday:  No intake/output data recorded. This shift:  No intake/output data recorded.  Bowel function:  Flatus: YES  BM:  No  Drain: (No drain)   Physical Exam:  General: Pt awake/alert in no acute distress Eyes: PERRL, normal EOM.  Sclera clear.  No icterus Neuro: CN II-XII intact w/o focal sensory/motor deficits. Lymph: No head/neck/groin lymphadenopathy Psych:  No delerium/psychosis/paranoia.  Oriented  x 4 HENT: Normocephalic, Mucus membranes moist.  No thrush Neck: Supple, No tracheal deviation.  No obvious thyromegaly Chest: No pain to chest wall compression.  Good respiratory excursion.  No audible wheezing CV:  Pulses intact.  Regular rhythm.  No major extremity edema MS: Normal AROM mjr joints.  No obvious deformity  Abdomen: Soft.  Nondistended.  Normal discomfort in left lower quadrant.  No focal peritonitis..  No evidence of  peritonitis.  No incarcerated hernias.  Ext:   No deformity.  No mjr edema.  No cyanosis Skin: No petechiae / purpurea.  No major sores.  Warm and dry    Results:   Cultures: Recent Results (from the past 720 hour(s))  Blood culture (routine x 2)     Status: None (Preliminary result)   Collection Time: 08/05/22  7:37 PM   Specimen: BLOOD  Result Value Ref Range Status   Specimen Description BLOOD SITE NOT SPECIFIED  Final   Special Requests   Final    BOTTLES DRAWN AEROBIC AND ANAEROBIC Blood Culture results may not be optimal due to an excessive volume of blood received in culture bottles   Culture   Final    NO GROWTH < 12 HOURS Performed at [facility] [address] [place], [place] [address]    Report Status PENDING  Incomplete  Blood culture (routine x 2)     Status: None (Preliminary result)   Collection Time: 08/05/22  9:21 PM   Specimen: BLOOD  Result Value Ref Range Status   Specimen Description BLOOD SITE NOT SPECIFIED  Final   Special Requests   Final    BOTTLES DRAWN AEROBIC AND ANAEROBIC Blood Culture adequate volume   Culture   Final    NO GROWTH < 12 HOURS Performed at [facility], [place] [address], [place], [place] [address]    Report Status PENDING  Incomplete    Labs: Results for orders placed or performed during the hospital encounter of 08/05/22 (from the past 48 hour(s))  Blood culture (routine x 2)     Status: None (Preliminary result)   Collection Time: 08/05/22  7:37 PM   Specimen: BLOOD  Result Value Ref Range   Specimen Description BLOOD SITE NOT SPECIFIED    Special Requests      BOTTLES DRAWN AEROBIC AND ANAEROBIC Blood Culture results may not be optimal due to an excessive volume of blood received in culture bottles   Culture      NO GROWTH < 12 HOURS Performed at [facility] [address] [place], [place] [address]    Report Status PENDING   Lactic acid, plasma     Status: None   Collection Time: 08/05/22  9:21  PM  Result Value Ref Range   Lactic Acid, Venous 1.2 0.5 - 1.9 mmol/L    Comment: Performed at [facility], [place] [address] [place], [place] [address]  Blood culture (routine x 2)     Status: None (Preliminary result)   Collection Time: 08/05/22  9:21 PM   Specimen: BLOOD  Result Value Ref Range   Specimen Description BLOOD SITE NOT SPECIFIED    Special Requests      BOTTLES DRAWN AEROBIC AND ANAEROBIC Blood Culture adequate volume   Culture      NO GROWTH < 12 HOURS Performed at [facility], [place] [address], [place],  [address]    Report Status PENDING   HIV Antibody (routine testing  w rflx)     Status: None   Collection Time: 08/06/22  1:57 AM  Result Value Ref Range   HIV Screen 4th Generation wRfx Non Reactive Non Reactive    Comment: Performed at [facility], [place] [address] [place], [place] [address]  CBC     Status: Abnormal   Collection Time: 08/06/22  1:57 AM  Result Value Ref Range   WBC 10.8 (H) 4.0 - 10.5 K/uL   RBC 4.56 4.22 - 5.81 MIL/uL   Hemoglobin 14.2 13.0 - 17.0 g/dL   HCT 40.6 39.0 - 52.0 %   MCV 89.0 80.0 - 100.0 fL   MCH 31.1 26.0 - 34.0 pg   MCHC 35.0 30.0 - 36.0 g/dL   RDW 13.1 11.5 - 15.5 %   Platelets 167 150 - 400 K/uL   nRBC 0.0 0.0 - 0.2 %    Comment: Performed at [facility], [place] [address], [place], [place] [address]  Basic metabolic panel     Status: Abnormal   Collection Time: 08/06/22  1:57 AM  Result Value Ref Range   Sodium 134 (L) 135 - 145 mmol/L   Potassium 3.5 3.5 - 5.1 mmol/L   Chloride 102 98 - 111 mmol/L   CO2 23 22 - 32 mmol/L   Glucose, Bld 94 70 - 99 mg/dL    Comment: Glucose reference range applies only to samples taken after fasting for at least 8 hours.   BUN 16 6 - 20 mg/dL   Creatinine, Ser 1.26 (H) 0.61 - 1.24 mg/dL   Calcium 8.7 (L) 8.9 - 10.3 mg/dL   GFR, Estimated >60 >60 mL/min    Comment: (NOTE) Calculated using the CKD-EPI Creatinine Equation (2021)    Anion gap 9 5 - 15     Comment: Performed at [facility] [address], [place], [place] [address]    Imaging / Studies: CT ABDOMEN PELVIS W CONTRAST  Result Date: 08/05/2022 CLINICAL DATA:  Left lower quadrant pain, history diverticulitis EXAM: CT ABDOMEN AND PELVIS WITH CONTRAST TECHNIQUE: Multidetector CT imaging of the abdomen and pelvis was performed using the standard protocol following bolus administration of intravenous contrast. RADIATION DOSE REDUCTION: This exam was performed according to the departmental dose-optimization program which includes automated exposure control, adjustment of the mA and/or kV according to patient size and/or use of iterative reconstruction technique. CONTRAST:  89m OMNIPAQUE IOHEXOL 350 MG/ML SOLN COMPARISON:  CT 05/15/2019 FINDINGS: Lower chest: No pleural or pericardial effusion. Linear scarring or atelectasis in the lung bases, improved since previous. Hepatobiliary: No focal liver abnormality is seen. No gallstones, gallbladder wall thickening, or biliary dilatation. Pancreas: Unremarkable. No pancreatic ductal dilatation or surrounding inflammatory changes. Spleen: Normal in size without focal abnormality. Adrenals/Urinary Tract: No adrenal mass. 2 mm left lower pole renal calculus. Symmetric renal enhancement without focal lesion or hydronephrosis. Urinary bladder is incompletely distended. Stomach/Bowel: Stomach decompressed. Small bowel nondistended, unremarkable. Normal appendix. The colon is incompletely distended. There is marked wall thickening and in the proximal sigmoid colon with effacement of the lumen and moderate regional inflammatory/edematous changes, new since previous. Low-attenuation poorly marginated regions in the wall may represent developing intramural abscesses, but no drainable components. A few small regional diverticula identified. Vascular/Lymphatic: No significant vascular findings are present. No enlarged abdominal or pelvic lymph nodes.  Reproductive: Prostate is unremarkable. Other: No ascites.  No free air. Musculoskeletal: Benign bone island in the left femoral neck, seen back to 11/07/2007. Mild narrowing  of interspaces L4-5 and L5-S1. No acute findings. IMPRESSION: 1. Moderate sigmoid diverticulitis, with possible developing intramural abscesses but no drainable components. Consider follow-up to confirm appropriate resolution and exclude mucosal lesion. 2. Left nephrolithiasis without hydronephrosis. Electronically Signed   By: Lucrezia Europe M.D.   On: 08/05/2022 12:35    Medications / Allergies: per chart  Antibiotics: Anti-infectives (From admission, onward)    Start     Dose/Rate Route Frequency Ordered Stop   08/06/22 2200  piperacillin-tazobactam (ZOSYN) IVPB 3.375 g  Status:  Discontinued       Note to Pharmacy: Should have received first Zosyn dose, this is continuation   3.375 g 12.5 mL/hr over 240 Minutes Intravenous Every 8 hours 08/05/22 2044 08/06/22 0226   08/06/22 0315  piperacillin-tazobactam (ZOSYN) IVPB 3.375 g       Note to Pharmacy: Should have received first Zosyn dose, this is continuation   3.375 g 12.5 mL/hr over 240 Minutes Intravenous Every 8 hours 08/06/22 0226 08/13/22 0559   08/05/22 1945  piperacillin-tazobactam (ZOSYN) IVPB 3.375 g  Status:  Discontinued        3.375 g 100 mL/hr over 30 Minutes Intravenous  Once 08/05/22 1931 08/05/22 2059         Note: Portions of this report may have been transcribed using voice recognition software. Every effort was made to ensure accuracy; however, inadvertent computerized transcription errors may be present.   Any transcriptional errors that result from this process are unintentional.    Adin Hector, MD, FACS, MASCRS Esophageal, Gastrointestinal & Colorectal Surgery Robotic and Minimally Invasive Surgery  [facility]. [address] [place], [place] [address] [phone]  Fax [phone] Main  CONTACT INFORMATION:  Weekday (9AM-5PM): Call [facility] main office at [phone]  Weeknight (5PM-9AM) or Weekend/Holiday: Check [email or web address] (password " [email or web address]") for General Surgery [facility] coverage  (Please, do not use SecureChat as it is not reliable communication to reach operating surgeons for immediate patient care given surgeries/outpatient duties/clinic/cross-coverage/off post-call which would lead to a delay in care.  Epic staff messaging available for outptient concerns, but may not be answered for 48 hours or more).     08/07/2022  11:01 AM

## 2022-08-07 NOTE — Discharge Instructions (Signed)
EATING AFTER A DIVERTICULITIS ATTACK   EAT START WITH PUREED OR SOFT FOODS Gradually transition to a high fiber diet with a fiber supplement over the next few days after discharge  WALK Walk an hour a day.  Control your pain to do that.    CONTROL PAIN Control pain so that you can walk, sleep, tolerate sneezing/coughing, go up/down stairs.  HAVE A BOWEL MOVEMENT DAILY Keep your bowels regular to avoid problems.  OK to try a laxative to override constipation.  OK to use an antidairrheal to slow down diarrhea.  Call if not better after 2 tries  CALL IF YOU HAVE PROBLEMS/CONCERNS Call if you are still struggling despite following these instructions. Call if you have concerns not answered by these instructions     After your attack of DIVERTICULITIS, expect some issues over the next few weeks.    To help you through this temporary phase, we start you out on a pureed (blenderized) diet.  Your first meal in the hospital was thin liquids.  You should have been given a pureed diet by the time you left the hospital.  We ask patients to stay on a pureed diet for the first few days to avoid anything getting "stuck."  Don't be alarmed if your ability to swallow doesn't progress according to this plan.  Everyone is different and some diets can advance more or less quickly.     Some BASIC RULES to follow are: Maintain an upright position whenever eating or drinking. Take small bites - just a teaspoon size bite at a time. Eat slowly.  It may also help to eat only one food at a time. Consider nibbling through smaller, more frequent meals & avoid the urge to eat BIG meals Do not push through feelings of fullness, nausea, or bloatedness Do not mix solid foods and liquids in the same mouthful Try not to "wash foods down" with large gulps of liquids. Avoid carbonated (bubbly/fizzy) drinks.   Avoid foods that make you feel gassy or bloated.  Start with bland foods first.  Wait on trying greasy,  fried, or spicy meals until you are tolerating more bland solids well. Expect to be more gassy/flatulent/bloated initially.  Walking will help your body manage it better. Consider using medications for bloating that contain simethicone such as  Maalox or Gas-X  Eat in a relaxed atmosphere & minimize distractions. Avoid talking while eating.   Do not use straws. Following each meal, sit in an upright position (90 degree angle) for 60 to 90 minutes.  Going for a short walk can help as well If food does stick, don't panic.  Try to relax and let the food pass on its own.  Sipping WARM LIQUID such as strong hot black tea can also help slide it down.   Be gradual in changes & use common sense:  -If you easily tolerating a certain "level" of foods, advance to the next level gradually -If you are having trouble swallowing a particular food, then avoid it.   -If food is sticking when you advance your diet, go back to thinner previous diet (the lower LEVEL) for 1-2 days.  LEVEL 1 = PUREED DIET  Do for the first Sweetser in this group are pureed or blenderized to a smooth, mashed potato-like consistency.  -If necessary, the pureed foods can keep their shape with the addition of a thickening agent.   -Meat should be pureed to a smooth, pasty consistency.  Hot broth or gravy may be added to the pureed meat, approximately 1 oz. of liquid per 3 oz. serving of meat. -CAUTION:  If any foods do not puree into a smooth consistency, swallowing will be more difficult.  (For example, nuts or seeds sometimes do not blend well.)  Hot Foods Cold Foods  Pureed scrambled eggs and cheese Pureed cottage cheese  Baby cereals Thickened juices and nectars  Thinned cooked cereals (no lumps) Thickened milk or eggnog  Pureed [place] toast or pancakes Ensure  Mashed potatoes Ice cream  Pureed parsley, au gratin, scalloped potatoes, candied sweet potatoes Fruit or [place] ice, sherbet   Pureed buttered or alfredo noodles Plain yogurt  Pureed vegetables (no corn or peas) Instant breakfast  Pureed soups and creamed soups Smooth pudding, mousse, custard  Pureed scalloped apples Whipped gelatin  Gravies Sugar, syrup, honey, jelly  Sauces, cheese, tomato, barbecue, white, creamed Cream  Any baby food Creamer  Alcohol in moderation (not beer or champagne) Margarine  Coffee or tea Mayonnaise   Ketchup, mustard   Apple sauce   SAMPLE MENU:  PUREED DIET Breakfast Lunch Dinner  Orange juice, 1/2 cup Cream of wheat, 1/2 cup Pineapple juice, 1/2 cup Pureed [place], barley soup, 3/4 cup Pureed Hawaiian chicken, 3 oz  Scrambled eggs, mashed or blended with cheese, 1/2 cup Tea or coffee, 1 cup  Whole milk, 1 cup  Non-dairy creamer, 2 Tbsp. Mashed potatoes, 1/2 cup Pureed cooled broccoli, 1/2 cup Apple sauce, 1/2 cup Coffee or tea Mashed potatoes, 1/2 cup Pureed spinach, 1/2 cup Frozen yogurt, 1/2 cup Tea or coffee      LEVEL 2 = SOFT DIET  After your first few days, you can advance to a soft, low residue diet.   Keep on this diet until everything goes down easily.  Hot Foods Cold Foods  White fish Cottage cheese  Stuffed fish Junior baby fruit  Baby food meals Semi thickened juices  Minced soft cooked, scrambled, poached eggs nectars  Souffle & omelets Ripe mashed bananas  Cooked cereals Canned fruit, pineapple sauce, milk  potatoes Milkshake  Buttered or Alfredo noodles Custard  Cooked cooled vegetable Puddings, including tapioca  [place]bet Yogurt  Vegetable soup or alphabet soup Fruit ice, [place] ice  Gravies Whipped gelatin  Sugar, syrup, honey, jelly Junior baby desserts  Sauces:  Cheese, creamed, barbecue, tomato, white Cream  Coffee or tea Margarine   SAMPLE MENU:  LEVEL 2 Breakfast Lunch Dinner  Orange juice, 1/2 cup Oatmeal, 1/2 cup Scrambled eggs with cheese, 1/2 cup Decaffeinated tea, 1 cup Whole milk, 1 cup Non-dairy creamer, 2 Tbsp Pineapple  juice, 1/2 cup Minced beef, 3 oz Gravy, 2 Tbsp Mashed potatoes, 1/2 cup Minced fresh broccoli, 1/2 cup Applesauce, 1/2 cup Coffee, 1 cup [place], barley soup, 3/4 cup Minced Hawaiian chicken, 3 oz Mashed potatoes, 1/2 cup Cooked spinach, 1/2 cup Frozen yogurt, 1/2 cup Non-dairy creamer, 2 Tbsp      LEVEL 3 = CHOPPED DIET  -After all the foods in level 2 (soft diet) are passing through well you should advance up to more chopped foods.  -It is still important to cut these foods into small pieces and eat slowly.  Hot Foods Cold Foods  Poultry Cottage cheese  Chopped Swedish meatballs Yogurt  Meat salads (ground or flaked meat) Milk  Flaked fish (tuna) Milkshakes  Poached or scrambled eggs Soft, cold, dry cereal  Souffles and omelets Fruit juices or nectars  Cooked cereals Chopped canned fruit  Chopped [place]  toast or pancakes Canned fruit cocktail  Noodles or pasta (no rice) Pudding, mousse, custard  Cooked vegetables (no frozen peas, corn, or mixed vegetables) Green salad  Canned small sweet peas Ice cream  Creamed soup or vegetable soup Fruit ice, [place] ice  Pureed vegetable soup or alphabet soup Non-dairy creamer  Ground scalloped apples Margarine  Gravies Mayonnaise  Sauces:  Cheese, creamed, barbecue, tomato, white Ketchup  Coffee or tea Mustard   SAMPLE MENU:  LEVEL 3 Breakfast Lunch Dinner  Orange juice, 1/2 cup Oatmeal, 1/2 cup Scrambled eggs with cheese, 1/2 cup Decaffeinated tea, 1 cup Whole milk, 1 cup Non-dairy creamer, 2 Tbsp Ketchup, 1 Tbsp Margarine, 1 tsp Salt, 1/4 tsp Sugar, 2 tsp Pineapple juice, 1/2 cup Ground beef, 3 oz Gravy, 2 Tbsp Mashed potatoes, 1/2 cup Cooked spinach, 1/2 cup Applesauce, 1/2 cup Decaffeinated coffee Whole milk Non-dairy creamer, 2 Tbsp Margarine, 1 tsp Salt, 1/4 tsp Pureed [place], barley soup, 3/4 cup Barbecue chicken, 3 oz Mashed potatoes, 1/2 cup Ground fresh broccoli, 1/2 cup Frozen yogurt, 1/2  cup Decaffeinated tea, 1 cup Non-dairy creamer, 2 Tbsp Margarine, 1 tsp Salt, 1/4 tsp Sugar, 1 tsp    LEVEL 4:  HIGH FIBER DIET / REGULAR FOODS  -Foods in this group are soft, moist, regularly textured foods.   -This level includes meat and breads, which tend to be the hardest things to swallow.   -Eat very slowly, chew well and continue to avoid carbonated drinks. -most people are at this level in 2-4 weeks  Hot Foods Cold Foods  Baked fish or skinned Soft cheeses - cottage cheese  Souffles and omelets Cream cheese  Eggs Yogurt  Stuffed shells Milk  Spaghetti with meat sauce Milkshakes  Cooked cereal Cold dry cereals (no nuts, dried fruit, coconut)  [place] toast or pancakes Crackers  Buttered toast Fruit juices or nectars  Noodles or pasta (no rice) Canned fruit  Potatoes (all types) Ripe bananas  Soft, cooked vegetables (no corn, lima, or baked beans) Peeled, ripe, fresh fruit  Creamed soups or vegetable soup Cakes (no nuts, dried fruit, coconut)  Canned chicken noodle soup Plain doughnuts  Gravies Ice cream  Bacon dressing Pudding, mousse, custard  Sauces:  Cheese, creamed, barbecue, tomato, white Fruit ice, [place] ice, sherbet  Decaffeinated tea or coffee Whipped gelatin  Pork chops Regular gelatin   Canned fruited gelatin molds   Sugar, syrup, honey, jam, jelly   Cream   Non-dairy   Margarine   Oil   Mayonnaise   Ketchup   Mustard   TROUBLESHOOTING IRREGULAR BOWELS  1) Avoid extremes of bowel movements (no bad constipation/diarrhea)  2) Miralax 17gm mixed in 8oz. water or juice-daily. May use BID as needed.  3) Gas-x,Phazyme, etc. as needed for gas & bloating.  4) Soft,bland diet. No spicy,greasy,fried foods.  5) Prilosec over-the-counter as needed  6) May hold gluten/wheat products from diet to see if symptoms improve.  7) May try probiotics (Align, Activa, etc) to help calm the bowels down  7) If symptoms become worse call back immediately.    If you  have any questions please call our office at [place]: [phone].     This information is not intended to replace advice given to you by your health care provider. Make sure you discuss any questions you have with your health care provider.  Diverticulitis  Diverticulitis is infection or inflammation of small pouches (diverticula) in the colon that form due to a condition called diverticulosis. Diverticula  can trap stool (feces) and bacteria, causing infection and inflammation. Diverticulitis may cause severe stomach pain and diarrhea. It may lead to tissue damage in the colon that causes bleeding. The diverticula may also burst (rupture) and cause infected stool to enter other areas of the abdomen. Complications of diverticulitis can include: Bleeding. Severe infection. Severe pain. Rupture (perforation) of the colon. Blockage (obstruction) of the colon. What are the causes? This condition is caused by stool becoming trapped in the diverticula, which allows bacteria to grow in the diverticula. This leads to inflammation and infection. What increases the risk? You are more likely to develop this condition if: You have diverticulosis. The risk for diverticulosis increases if: You are overweight or obese. You use tobacco products. You do not get enough exercise. You eat a diet that does not include enough fiber. High-fiber foods include fruits, vegetables, beans, nuts, and whole grains. What are the signs or symptoms? Symptoms of this condition may include: Pain and tenderness in the abdomen. The pain is normally located on the left side of the abdomen, but it may occur in other areas. Fever and chills. Bloating. Cramping. Nausea. Vomiting. Changes in bowel routines. Blood in your stool. How is this diagnosed? This condition is diagnosed based on: Your medical history. A physical exam. Tests to make sure there is nothing else causing your condition. These  tests may include: Blood tests. Urine tests. Imaging tests of the abdomen, including X-rays, ultrasounds, MRIs, or CT scans. How is this treated? Most cases of this condition are mild and can be treated at home. Treatment may include: Taking over-the-counter pain medicines. Following a clear liquid diet. Taking antibiotic medicines by mouth. Rest. More severe cases may need to be treated at a hospital. Treatment may include: Not eating or drinking. Taking prescription pain medicine. Receiving antibiotic medicines through an IV tube. Receiving fluids and nutrition through an IV tube. Surgery. When your condition is under control, your health care provider may recommend that you have a colonoscopy. This is an exam to look at the entire large intestine. During the exam, a lubricated, bendable tube is inserted into the anus and then passed into the rectum, colon, and other parts of the large intestine. A colonoscopy can show how severe your diverticula are and whether something else may be causing your symptoms. Follow these instructions at home: Medicines Take over-the-counter and prescription medicines only as told by your health care provider. These include fiber supplements, probiotics, and stool softeners. If you were prescribed an antibiotic medicine, take it as told by your health care provider. Do not stop taking the antibiotic even if you start to feel better. Do not drive or use heavy machinery while taking prescription pain medicine. General instructions  Follow a full liquid diet or another diet as directed by your health care provider. After your symptoms improve, your health care provider may tell you to change your diet. He or she may recommend that you eat a diet that contains at least 25 g (25 grams) of fiber daily. Fiber makes it easier to pass stool. Healthy sources of fiber include: Berries. One cup contains 4-8 grams of fiber. Beans or lentils. One half cup contains 5-8  grams of fiber. Green vegetables. One cup contains 4 grams of fiber. Exercise for at least 30 minutes, 3 times each week. You should exercise hard enough to raise your heart rate and break a sweat. Keep all follow-up visits as told by your health care provider. This is important. You  may need a colonoscopy. Contact a health care provider if: Your pain does not improve. You have a hard time drinking or eating food. Your bowel movements do not return to normal. Get help right away if: Your pain gets worse. Your symptoms do not get better with treatment. Your symptoms suddenly get worse. You have a fever. You vomit more than one time. You have stools that are bloody, black, or tarry. Summary Diverticulitis is infection or inflammation of small pouches (diverticula) in the colon that form due to a condition called diverticulosis. Diverticula can trap stool (feces) and bacteria, causing infection and inflammation. You are at higher risk for this condition if you have diverticulosis and you eat a diet that does not include enough fiber. Most cases of this condition are mild and can be treated at home. More severe cases may need to be treated at a hospital. When your condition is under control, your health care provider may recommend that you have an exam called a colonoscopy. This exam can show how severe your diverticula are and whether something else may be causing your symptoms. This information is not intended to replace advice given to you by your health care provider. Make sure you discuss any questions you have with your health care provider. Document Revised: 09/08/2017 Document Reviewed: 10/29/2016 [employer] Patient Education  2020 [employer].

## 2022-08-08 ENCOUNTER — Other Ambulatory Visit (HOSPITAL_COMMUNITY): Payer: Self-pay

## 2022-08-08 LAB — CBC
HCT: 42.6 % (ref 39.0–52.0)
Hemoglobin: 14.6 g/dL (ref 13.0–17.0)
MCH: 31.1 pg (ref 26.0–34.0)
MCHC: 34.3 g/dL (ref 30.0–36.0)
MCV: 90.6 fL (ref 80.0–100.0)
Platelets: 164 10*3/uL (ref 150–400)
RBC: 4.7 MIL/uL (ref 4.22–5.81)
RDW: 12.9 % (ref 11.5–15.5)
WBC: 6.5 10*3/uL (ref 4.0–10.5)
nRBC: 0 % (ref 0.0–0.2)

## 2022-08-08 LAB — CREATININE, SERUM
Creatinine, Ser: 1.24 mg/dL (ref 0.61–1.24)
GFR, Estimated: >60 mL/min (ref >60)

## 2022-08-08 LAB — POTASSIUM: Potassium: 4.1 mmol/L (ref 3.5–5.1)

## 2022-08-08 MED ORDER — AMOXICILLIN-POT CLAVULANATE 875-125 MG PO TABS
1.0000 | ORAL_TABLET | Freq: Two times a day (BID) | ORAL | 0 refills | Status: AC
  Filled 2022-08-08: qty 22, 11d supply, fill #0

## 2022-08-08 MED ORDER — AMOXICILLIN-POT CLAVULANATE 875-125 MG PO TABS: 1.0000 | ORAL_TABLET | Freq: Two times a day (BID) | ORAL | 0 refills | Status: DC

## 2022-08-08 NOTE — Discharge Summary (Signed)
Physician Discharge Summary  Roger Jennings NWG:[id number] DOB: January 04, 1976 DOA: 08/05/2022  PCP: Patient, No Pcp Per  Admit date: 08/05/2022 Discharge date: 08/08/2022 30 Day Unplanned Readmission Risk Score    Flowsheet Row ED to Hosp-Admission (Current) from 08/05/2022 in [facility]  SURGICAL  30 Day Unplanned Readmission Risk Score (%) 8.22 Filed at 08/08/2022 1200       This score is the patient's risk of an unplanned readmission within 30 days of being discharged (0 -100%). The score is based on dignosis, age, lab data, medications, orders, and past utilization.   Low:  0-14.9   Medium: 15-21.9   High: 22-29.9   Extreme: 30 and above          Admitted From: Home Disposition: Home Home  Recommendations for Outpatient Follow-up:  Follow up with PCP in 1-2 weeks Please obtain BMP/CBC in one week Please follow up with your PCP on the following pending results: Unresulted Labs (From admission, onward)     Start     Ordered   08/12/22 0500  Creatinine, serum  (enoxaparin (LOVENOX)    CrCl >/= 30 ml/min)  Weekly,   R     Comments: while on enoxaparin therapy    08/05/22 2027              Home Health: None Equipment/Devices: None  Discharge Condition: Stable CODE STATUS: Full code Diet recommendation: Cardiac  Subjective: Patient seen and examined.  He has no complaints.  No abdominal pain.  Tolerating soft diet and feels comfortable going home.  Cleared by general surgery for discharge as well.  Brief/Interim Summary: Roger Jennings is a 46 y.o. male with medical history significant of Gout, Diverticulitis who presented with complains of abdominal pain with associated nausea of 1 week duration. Of note, he has prior history of diverticulitis and reports similar symptoms approximately ever year to 1.5 yrs that he typically treats with pain medications. He has not seen GI for a few years.   In the ED, he was found to be febrile to 100.16F  and hemodynamically stable on room air. CT A/P demonstrated proximal sigmoid diverticultis/colitis with phlegmon. Surgery was consulted and recommend conservative management.  Patient has improved, he has tolerated soft diet now.  They have cleared him for discharge.  He has remained on IV Zosyn since admission.  Discharging on oral Augmentin for 7 more days per general surgery recommendation.   Hyperkalemia: Resolved.   AKI: Likely due to poor p.o. intake.  Resolved.  Discharge plan was discussed with patient and/or family member and they verbalized understanding and agreed with it.  Discharge Diagnoses:  Principal Problem:   Diverticulitis Active Problems:   AKI (acute kidney injury) [facility])    Discharge Instructions  Discharge Instructions     Ambulatory referral to Gastroenterology   Complete by: As directed    Recurrent diverticulitis   What is the reason for referral?: Other   Ambulatory referral to Gastroenterology   Complete by: As directed    Recurrent diverticulitis   What is the reason for referral?: Colonoscopy      Allergies as of 08/08/2022       Reactions   Indomethacin Er Other (See Comments)   Gastrointestinal upset        Medication List     TAKE these medications    acetaminophen 500 MG tablet Commonly known as: TYLENOL Take 1,000 mg by mouth every 6 (six) hours as needed for moderate pain.  allopurinol 100 MG tablet Commonly known as: ZYLOPRIM Take 2 tablets (200 mg total) by mouth daily.   amoxicillin-clavulanate 875-125 MG tablet Commonly known as: AUGMENTIN Take 1 tablet by mouth 2 (two) times daily for 11 days.        Follow-up Information     Michael Boston, MD. Schedule an appointment as soon as possible for a visit in 2 week(s).   Specialties: General Surgery, Colon and Rectal Surgery Why: To follow up after your hospital stay Contact information: [place] [address] [facility]. Call.   Specialty: Gastroenterology Why: call for follow up after hospitalization Contact information: [place] [address] [place], [place], NP Follow up.   Specialty: Internal Medicine Why: August 24, 2022 at 1:30 pm Contact information: [address] [place] [address] [phone]                Allergies  Allergen Reactions   Indomethacin Er Other (See Comments)    Gastrointestinal upset    Consultations: General surgery   Procedures/Studies: CT ABDOMEN PELVIS W CONTRAST  Result Date: 08/05/2022 CLINICAL DATA:  Left lower quadrant pain, history diverticulitis EXAM: CT ABDOMEN AND PELVIS WITH CONTRAST TECHNIQUE: Multidetector CT imaging of the abdomen and pelvis was performed using the standard protocol following bolus administration of intravenous contrast. RADIATION DOSE REDUCTION: This exam was performed according to the departmental dose-optimization program which includes automated exposure control, adjustment of the mA and/or kV according to patient size and/or use of iterative reconstruction technique. CONTRAST:  90m OMNIPAQUE IOHEXOL 350 MG/ML SOLN COMPARISON:  CT 05/15/2019 FINDINGS: Lower chest: No pleural or pericardial effusion. Linear scarring or atelectasis in the lung bases, improved since previous. Hepatobiliary: No focal liver abnormality is seen. No gallstones, gallbladder wall thickening, or biliary dilatation. Pancreas: Unremarkable. No pancreatic ductal dilatation or surrounding inflammatory changes. Spleen: Normal in size without focal abnormality. Adrenals/Urinary Tract: No adrenal mass. 2 mm left lower pole renal calculus. Symmetric renal enhancement without focal lesion or hydronephrosis. Urinary bladder is incompletely distended. Stomach/Bowel: Stomach decompressed. Small bowel nondistended, unremarkable. Normal appendix. The colon is incompletely  distended. There is marked wall thickening and in the proximal sigmoid colon with effacement of the lumen and moderate regional inflammatory/edematous changes, new since previous. Low-attenuation poorly marginated regions in the wall may represent developing intramural abscesses, but no drainable components. A few small regional diverticula identified. Vascular/Lymphatic: No significant vascular findings are present. No enlarged abdominal or pelvic lymph nodes. Reproductive: Prostate is unremarkable. Other: No ascites.  No free air. Musculoskeletal: Benign bone island in the left femoral neck, seen back to 11/07/2007. Mild narrowing of interspaces L4-5 and L5-S1. No acute findings. IMPRESSION: 1. Moderate sigmoid diverticulitis, with possible developing intramural abscesses but no drainable components. Consider follow-up to confirm appropriate resolution and exclude mucosal lesion. 2. Left nephrolithiasis without hydronephrosis. Electronically Signed   By: DLucrezia EuropeM.D.   On: 08/05/2022 12:35     Discharge Exam: Vitals:   08/08/22 0436 08/08/22 0846  BP: 110/75 125/75  Pulse: 70 85  Resp:  18  Temp: 98 F (36.7 C) 97.7 F (36.5 C)  SpO2: 97% 97%   Vitals:   08/07/22 1550 08/07/22 2007 08/08/22 0436 08/08/22 0846  BP: 121/77 120/72 110/75 125/75  Pulse: 76 85 70 85  Resp: '17 18  18  '$ Temp: 98.6 F (37  C) 98.1 F (36.7 C) 98 F (36.7 C) 97.7 F (36.5 C)  TempSrc: Oral Oral Oral Oral  SpO2: 98% 96% 97% 97%  Weight:      Height:        General: Pt is alert, awake, not in acute distress Cardiovascular: RRR, S1/S2 +, no rubs, no gallops Respiratory: CTA bilaterally, no wheezing, no rhonchi Abdominal: Soft, NT, ND, bowel sounds + Extremities: no edema, no cyanosis    The results of significant diagnostics from this hospitalization (including imaging, microbiology, ancillary and laboratory) are listed below for reference.     Microbiology: Recent Results (from the past 240 hour(s))   Blood culture (routine x 2)     Status: None (Preliminary result)   Collection Time: 08/05/22  7:37 PM   Specimen: BLOOD  Result Value Ref Range Status   Specimen Description BLOOD SITE NOT SPECIFIED  Final   Special Requests   Final    BOTTLES DRAWN AEROBIC AND ANAEROBIC Blood Culture results may not be optimal due to an excessive volume of blood received in culture bottles   Culture   Final    NO GROWTH 3 DAYS Performed at [facility], [place] [address], [place], [place] [address]    Report Status PENDING  Incomplete  Blood culture (routine x 2)     Status: None (Preliminary result)   Collection Time: 08/05/22  9:21 PM   Specimen: BLOOD  Result Value Ref Range Status   Specimen Description BLOOD SITE NOT SPECIFIED  Final   Special Requests   Final    BOTTLES DRAWN AEROBIC AND ANAEROBIC Blood Culture adequate volume   Culture   Final    NO GROWTH 3 DAYS Performed at [facility], [address], [place], [place] [address]    Report Status PENDING  Incomplete     Labs: BNP (last 3 results) No results for input(s): "BNP" in the last 8760 hours. Basic Metabolic Panel: Recent Labs  Lab 08/05/22 0920 08/06/22 0157 08/07/22 1153 08/08/22 0220  NA 138 134* 136  --   K 4.3 3.5 5.2* 4.1  CL 101 102 100  --   CO2 '23 23 24  '$ --   GLUCOSE 105* 94 147*  --   BUN '18 16 12  '$ --   CREATININE 1.42* 1.26* 1.30* 1.24  CALCIUM 9.5 8.7* 9.0  --    Liver Function Tests: Recent Labs  Lab 08/05/22 0920  AST 37  ALT 45*  ALKPHOS 67  BILITOT 1.3*  PROT 8.0  ALBUMIN 3.9   Recent Labs  Lab 08/05/22 0920  LIPASE 28   No results for input(s): "AMMONIA" in the last 168 hours. CBC: Recent Labs  Lab 08/05/22 0920 08/06/22 0157 08/08/22 0220  WBC 11.9* 10.8* 6.5  HGB 16.3 14.2 14.6  HCT 47.5 40.6 42.6  MCV 89.1 89.0 90.6  PLT 168 167 164   Cardiac Enzymes: No results for input(s): "CKTOTAL", "CKMB", "CKMBINDEX", "TROPONINI" in the last 168 hours. BNP: Invalid  input(s): "POCBNP" CBG: No results for input(s): "GLUCAP" in the last 168 hours. D-Dimer No results for input(s): "DDIMER" in the last 72 hours. Hgb A1c No results for input(s): "HGBA1C" in the last 72 hours. Lipid Profile No results for input(s): "CHOL", "HDL", "LDLCALC", "TRIG", "CHOLHDL", "LDLDIRECT" in the last 72 hours. Thyroid function studies No results for input(s): "TSH", "T4TOTAL", "T3FREE", "THYROIDAB" in the last 72 hours.  Invalid input(s): "FREET3" Anemia work up No results for input(s): "VITAMINB12", "FOLATE", "FERRITIN", "  TIBC", "IRON", "RETICCTPCT" in the last 72 hours. Urinalysis    Component Value Date/Time   COLORURINE AMBER (A) 08/05/2022 0951   APPEARANCEUR HAZY (A) 08/05/2022 0951   LABSPEC 1.035 (H) 08/05/2022 0951   PHURINE 5.0 08/05/2022 0951   GLUCOSEU NEGATIVE 08/05/2022 0951   HGBUR SMALL (A) 08/05/2022 0951   BILIRUBINUR NEGATIVE 08/05/2022 0951   KETONESUR 5 (A) 08/05/2022 0951   PROTEINUR 30 (A) 08/05/2022 0951   UROBILINOGEN 0.2 09/03/2014 1821   NITRITE NEGATIVE 08/05/2022 0951   LEUKOCYTESUR NEGATIVE 08/05/2022 0951   Sepsis Labs Recent Labs  Lab 08/05/22 0920 08/06/22 0157 08/08/22 0220  WBC 11.9* 10.8* 6.5   Microbiology Recent Results (from the past 240 hour(s))  Blood culture (routine x 2)     Status: None (Preliminary result)   Collection Time: 08/05/22  7:37 PM   Specimen: BLOOD  Result Value Ref Range Status   Specimen Description BLOOD SITE NOT SPECIFIED  Final   Special Requests   Final    BOTTLES DRAWN AEROBIC AND ANAEROBIC Blood Culture results may not be optimal due to an excessive volume of blood received in culture bottles   Culture   Final    NO GROWTH 3 DAYS Performed at [facility], [place] [address], [place], [place] [address]    Report Status PENDING  Incomplete  Blood culture (routine x 2)     Status: None (Preliminary result)   Collection Time: 08/05/22  9:21 PM   Specimen: BLOOD  Result Value Ref  Range Status   Specimen Description BLOOD SITE NOT SPECIFIED  Final   Special Requests   Final    BOTTLES DRAWN AEROBIC AND ANAEROBIC Blood Culture adequate volume   Culture   Final    NO GROWTH 3 DAYS Performed at [facility], [address] [place], [place] [address]    Report Status PENDING  Incomplete     Time coordinating discharge: Over 30 minutes  SIGNED:   Darliss Cheney, MD  [facility] 08/08/2022, 1:28 PM *Please note that this is a verbal dictation therefore any spelling or grammatical errors are due to the "[place] [employer]" system interpretation. If 7PM-7AM, please contact night-coverage [email or web address]

## 2022-08-08 NOTE — Progress Notes (Signed)
Elvera Lennox to be D/C'd  per MD order.  Discussed with the patient and all questions fully answered.  VSS, Skin clean, dry and intact without evidence of skin break down, no evidence of skin tears noted.  IV catheter discontinued intact. Site without signs and symptoms of complications. Dressing and pressure applied.  An After Visit Summary was printed and given to the patient. Patient received prescription from [facility].  D/c re-educated completed with patient/family including follow up instructions, medication list, d/c activities limitations if indicated, with other d/c instructions as indicated by MD - patient able to verbalize understanding, all questions fully answered.   Patient instructed to return to ED, call 911, or call MD for any changes in condition.   Patient to be escorted via [place], and D/C home via private auto.

## 2022-08-08 NOTE — Progress Notes (Signed)
Mobility Specialist - Progress Note   08/08/22 1207  Mobility  Activity Ambulated independently in hallway  Level of Assistance Modified independent, requires aide device or extra time  Assistive Device Other (Comment) (IV Pole)  Distance Ambulated (ft) 1100 ft  Activity Response Tolerated well  $Mobility charge 1 Mobility    Pt received in bed agreeable to mobility. Left in bed w/ call bell in reach and all needs met.   Paulla Dolly Mobility Specialist

## 2022-08-08 NOTE — Progress Notes (Addendum)
Progress Note     Subjective: Tolerating fulls and having bowel function. Pain much improved   Objective: Vital signs in last 24 hours: Temp:  [98 F (36.7 C)-98.6 F (37 C)] 98 F (36.7 C) (10/30 0436) Pulse Rate:  [70-85] 70 (10/30 0436) Resp:  [17-18] 18 (10/29 2007) BP: (110-121)/(72-77) 110/75 (10/30 0436) SpO2:  [96 %-98 %] 97 % (10/30 0436) Last BM Date : 08/06/22  Intake/Output from previous day: 10/29 0701 - 10/30 0700 In: 460 [P.O.:460] Out: -  Intake/Output this shift: No intake/output data recorded.  PE: General: pleasant, WD, male who is laying in bed in NAD Heart: regular, rate, and rhythm.   Lungs: Respiratory effort nonlabored Abd: soft, ND, +BS, minimal TTP LLQ MSK: all 4 extremities are symmetrical with no cyanosis, clubbing, or edema. Skin: warm and dry Psych: A&Ox3 with an appropriate affect.    Lab Results:  Recent Labs    08/06/22 0157 08/08/22 0220  WBC 10.8* 6.5  HGB 14.2 14.6  HCT 40.6 42.6  PLT 167 164   BMET Recent Labs    08/06/22 0157 08/07/22 1153 08/08/22 0220  NA 134* 136  --   K 3.5 5.2* 4.1  CL 102 100  --   CO2 23 24  --   GLUCOSE 94 147*  --   BUN 16 12  --   CREATININE 1.26* 1.30* 1.24  CALCIUM 8.7* 9.0  --    PT/INR No results for input(s): "LABPROT", "INR" in the last 72 hours. CMP     Component Value Date/Time   NA 136 08/07/2022 1153   NA 142 10/01/2018 1649   K 4.1 08/08/2022 0220   CL 100 08/07/2022 1153   CO2 24 08/07/2022 1153   GLUCOSE 147 (H) 08/07/2022 1153   BUN 12 08/07/2022 1153   BUN 18 10/01/2018 1649   CREATININE 1.24 08/08/2022 0220   CALCIUM 9.0 08/07/2022 1153   PROT 8.0 08/05/2022 0920   PROT 7.7 05/31/2017 1707   ALBUMIN 3.9 08/05/2022 0920   ALBUMIN 5.0 05/31/2017 1707   AST 37 08/05/2022 0920   ALT 45 (H) 08/05/2022 0920   ALKPHOS 67 08/05/2022 0920   BILITOT 1.3 (H) 08/05/2022 0920   BILITOT 0.7 05/31/2017 1707   GFRNONAA >60 08/08/2022 0220   GFRAA >60 05/15/2019  1135   Lipase     Component Value Date/Time   LIPASE 28 08/05/2022 0920       Studies/Results: No results found.  Anti-infectives: Anti-infectives (From admission, onward)    Start     Dose/Rate Route Frequency Ordered Stop   08/06/22 2200  piperacillin-tazobactam (ZOSYN) IVPB 3.375 g  Status:  Discontinued       Note to Pharmacy: Should have received first Zosyn dose, this is continuation   3.375 g 12.5 mL/hr over 240 Minutes Intravenous Every 8 hours 08/05/22 2044 08/06/22 0226   08/06/22 0315  piperacillin-tazobactam (ZOSYN) IVPB 3.375 g       Note to Pharmacy: Should have received first Zosyn dose, this is continuation   3.375 g 12.5 mL/hr over 240 Minutes Intravenous Every 8 hours 08/06/22 0226 08/13/22 0559   08/05/22 1945  piperacillin-tazobactam (ZOSYN) IVPB 3.375 g  Status:  Discontinued        3.375 g 100 mL/hr over 30 Minutes Intravenous  Once 08/05/22 1931 08/05/22 2059        Assessment/Plan Recurrent diverticulitis with probable microabscesses - continue IV abx - ileus improving - tolerating liquids. Advance to soft lunch -  hopefully he continues to improve and can follow up in 6-8 weeks with GI ([place]) and colorectal surgery (Dr. Johney Maine) for elective colectomy  FEN: soft ID: zosyn 10/27>> VTE: lovenox  Dispo: possible dc this pm vs tomorrow. IV abx until dc then transition to PO (augmentin for 14 days total abx coverage)  I reviewed hospitalist notes, last 24 h vitals and pain scores, last 48 h intake and output, last 24 h labs and trends, and last 24 h imaging results.     LOS: 3 days   [facility] 08/08/2022, 8:12 AM Please see Amion for pager number during day hours 7:00am-4:30pm

## 2022-08-09 ENCOUNTER — Ambulatory Visit: Payer: Self-pay | Admitting: Surgery

## 2022-08-09 DIAGNOSIS — R739 Hyperglycemia, unspecified: Secondary | ICD-10-CM

## 2022-08-10 LAB — CULTURE, BLOOD (ROUTINE X 2)
Culture: NO GROWTH
Culture: NO GROWTH
Special Requests: ADEQUATE

## 2022-08-15 ENCOUNTER — Encounter: Payer: Self-pay | Admitting: Nurse Practitioner

## 2022-08-15 ENCOUNTER — Ambulatory Visit (INDEPENDENT_AMBULATORY_CARE_PROVIDER_SITE_OTHER): Payer: Self-pay | Admitting: Nurse Practitioner

## 2022-08-15 VITALS — BP 112/79 | HR 88 | Temp 98.2°F | Ht 70.0 in | Wt 237.0 lb

## 2022-08-15 DIAGNOSIS — R739 Hyperglycemia, unspecified: Secondary | ICD-10-CM

## 2022-08-15 DIAGNOSIS — K5732 Diverticulitis of large intestine without perforation or abscess without bleeding: Secondary | ICD-10-CM

## 2022-08-15 NOTE — Progress Notes (Signed)
$'@Patient'x$  ID: Roger Jennings, male    DOB: 1976-02-28, 46 y.o.   MRN: [id number]  Chief Complaint  Patient presents with  . hospital f/u  . Follow-up    Pt stated--doing much better.    Referring provider: No ref. provider found  Recent significant events:  Hospital admission: 08/05/22  Brief/Interim Summary: Roger Jennings is a 46 y.o. male with medical history significant of Gout, Diverticulitis who presented with complains of abdominal pain with associated nausea of 1 week duration. Of note, he has prior history of diverticulitis and reports similar symptoms approximately ever year to 1.5 yrs that he typically treats with pain medications. He has not seen GI for a few years.   In the ED, he was found to be febrile to 100.14F and hemodynamically stable on room air. CT A/P demonstrated proximal sigmoid diverticultis/colitis with phlegmon. Surgery was consulted and recommend conservative management.  Patient has improved, he has tolerated soft diet now.  They have cleared him for discharge.  He has remained on IV Zosyn since admission.  Discharging on oral Augmentin for 7 more days per general surgery recommendation.   Hyperkalemia: Resolved.   AKI: Likely due to poor p.o. intake.  Resolved.   HPI     Does have appts scheduled with GI and colorectal surgery   Finishing antibiotics       Allergies  Allergen Reactions  . Indomethacin Er Other (See Comments)    Gastrointestinal upset     There is no immunization history on file for this patient.  Past Medical History:  Diagnosis Date  . Allergy   . Anxiety   . Diverticulitis   . Gout   . Kidney stones   . OSA on CPAP   . Sleep apnea     Tobacco History: Social History   Tobacco Use  Smoking Status Former  . Types: Cigarettes  . Quit date: 10/10/2001  . Years since quitting: 20.8  Smokeless Tobacco Never   Counseling given: Not Answered   Outpatient Encounter Medications as of 08/15/2022  Medication  Sig  . acetaminophen (TYLENOL) 500 MG tablet Take 1,000 mg by mouth every 6 (six) hours as needed for moderate pain.  Marland Kitchen allopurinol (ZYLOPRIM) 100 MG tablet Take 2 tablets (200 mg total) by mouth daily.  Marland Kitchen amoxicillin-clavulanate (AUGMENTIN) 875-125 MG tablet Take 1 tablet by mouth 2 (two) times daily for 11 days.   No facility-administered encounter medications on file as of 08/15/2022.     Review of Systems  Review of Systems     Physical Exam  BP 112/79   Pulse 88   Temp 98.2 F (36.8 C)   Ht '5\' 10"'$  (1.778 m)   Wt 237 lb (107.5 kg)   SpO2 96%   BMI 34.01 kg/m   Wt Readings from Last 5 Encounters:  08/15/22 237 lb (107.5 kg)  08/05/22 250 lb (113.4 kg)  06/13/19 236 lb (107 kg)  05/01/19 250 lb (113.4 kg)  05/31/17 235 lb 3.2 oz (106.7 kg)     Physical Exam   Lab Results:  CBC    Component Value Date/Time   WBC 6.5 08/08/2022 0220   RBC 4.70 08/08/2022 0220   HGB 14.6 08/08/2022 0220   HGB 15.1 05/31/2017 1710   HCT 42.6 08/08/2022 0220   HCT 45.5 05/31/2017 1710   PLT 164 08/08/2022 0220   PLT 173 05/31/2017 1710   MCV 90.6 08/08/2022 0220   MCV 90 05/31/2017 1710   MCH 31.1  08/08/2022 0220   MCHC 34.3 08/08/2022 0220   RDW 12.9 08/08/2022 0220   RDW 14.3 05/31/2017 1710   LYMPHSABS 1.8 09/03/2014 1744   MONOABS 1.2 (H) 09/03/2014 1744   EOSABS 0.1 09/03/2014 1744   BASOSABS 0.0 09/03/2014 1744    BMET    Component Value Date/Time   NA 136 08/07/2022 1153   NA 142 10/01/2018 1649   K 4.1 08/08/2022 0220   CL 100 08/07/2022 1153   CO2 24 08/07/2022 1153   GLUCOSE 147 (H) 08/07/2022 1153   BUN 12 08/07/2022 1153   BUN 18 10/01/2018 1649   CREATININE 1.24 08/08/2022 0220   CALCIUM 9.0 08/07/2022 1153   GFRNONAA >60 08/08/2022 0220   GFRAA >60 05/15/2019 1135    BNP No results found for: "BNP"  ProBNP No results found for: "PROBNP"  Imaging: CT ABDOMEN PELVIS W CONTRAST  Result Date: 08/05/2022 CLINICAL DATA:  Left lower  quadrant pain, history diverticulitis EXAM: CT ABDOMEN AND PELVIS WITH CONTRAST TECHNIQUE: Multidetector CT imaging of the abdomen and pelvis was performed using the standard protocol following bolus administration of intravenous contrast. RADIATION DOSE REDUCTION: This exam was performed according to the departmental dose-optimization program which includes automated exposure control, adjustment of the mA and/or kV according to patient size and/or use of iterative reconstruction technique. CONTRAST:  69m OMNIPAQUE IOHEXOL 350 MG/ML SOLN COMPARISON:  CT 05/15/2019 FINDINGS: Lower chest: No pleural or pericardial effusion. Linear scarring or atelectasis in the lung bases, improved since previous. Hepatobiliary: No focal liver abnormality is seen. No gallstones, gallbladder wall thickening, or biliary dilatation. Pancreas: Unremarkable. No pancreatic ductal dilatation or surrounding inflammatory changes. Spleen: Normal in size without focal abnormality. Adrenals/Urinary Tract: No adrenal mass. 2 mm left lower pole renal calculus. Symmetric renal enhancement without focal lesion or hydronephrosis. Urinary bladder is incompletely distended. Stomach/Bowel: Stomach decompressed. Small bowel nondistended, unremarkable. Normal appendix. The colon is incompletely distended. There is marked wall thickening and in the proximal sigmoid colon with effacement of the lumen and moderate regional inflammatory/edematous changes, new since previous. Low-attenuation poorly marginated regions in the wall may represent developing intramural abscesses, but no drainable components. A few small regional diverticula identified. Vascular/Lymphatic: No significant vascular findings are present. No enlarged abdominal or pelvic lymph nodes. Reproductive: Prostate is unremarkable. Other: No ascites.  No free air. Musculoskeletal: Benign bone island in the left femoral neck, seen back to 11/07/2007. Mild narrowing of interspaces L4-5 and L5-S1.  No acute findings. IMPRESSION: 1. Moderate sigmoid diverticulitis, with possible developing intramural abscesses but no drainable components. Consider follow-up to confirm appropriate resolution and exclude mucosal lesion. 2. Left nephrolithiasis without hydronephrosis. Electronically Signed   By: DLucrezia EuropeM.D.   On: 08/05/2022 12:35     Assessment & Plan:   No problem-specific Assessment & Plan notes found for this encounter.     TFenton Foy NP 08/15/2022

## 2022-08-15 NOTE — Patient Instructions (Signed)
1. Diverticulitis of colon  - Basic Metabolic Panel - CBC  Please keep follow up with specialist    Follow up:  Follow up in 3 months or sooner if needed

## 2022-08-16 LAB — BASIC METABOLIC PANEL
BUN/Creatinine Ratio: 13 (ref 9–20)
BUN: 18 mg/dL (ref 6–24)
CO2: 22 mmol/L (ref 20–29)
Calcium: 9.5 mg/dL (ref 8.7–10.2)
Chloride: 103 mmol/L (ref 96–106)
Creatinine, Ser: 1.44 mg/dL — ABNORMAL HIGH (ref 0.76–1.27)
Glucose: 100 mg/dL — ABNORMAL HIGH (ref 70–99)
Potassium: 4.7 mmol/L (ref 3.5–5.2)
Sodium: 140 mmol/L (ref 134–144)
eGFR: 61 mL/min/{1.73_m2} (ref >59)

## 2022-08-16 LAB — CBC
Hematocrit: 43.8 % (ref 37.5–51.0)
Hemoglobin: 14.8 g/dL (ref 13.0–17.7)
MCH: 30.3 pg (ref 26.6–33.0)
MCHC: 33.8 g/dL (ref 31.5–35.7)
MCV: 90 fL (ref 79–97)
Platelets: 257 10*3/uL (ref 150–450)
RBC: 4.88 x10E6/uL (ref 4.14–5.80)
RDW: 12.9 % (ref 11.6–15.4)
WBC: 7.1 10*3/uL (ref 3.4–10.8)

## 2022-08-18 ENCOUNTER — Encounter: Payer: Self-pay | Admitting: Nurse Practitioner

## 2022-08-18 LAB — POCT GLYCOSYLATED HEMOGLOBIN (HGB A1C): Hemoglobin A1C: 6.9 % — AB (ref 4.0–5.6)

## 2022-08-18 NOTE — Assessment & Plan Note (Signed)
-   Basic Metabolic Panel - CBC  Please keep follow up with specialist    Follow up:  Follow up in 3 months or sooner if needed

## 2022-08-19 ENCOUNTER — Telehealth: Payer: Self-pay | Admitting: Clinical

## 2022-08-22 ENCOUNTER — Telehealth: Payer: Self-pay

## 2022-08-22 ENCOUNTER — Ambulatory Visit: Payer: Self-pay | Admitting: Surgery

## 2022-08-22 ENCOUNTER — Encounter: Payer: Self-pay | Admitting: *Deleted

## 2022-08-22 DIAGNOSIS — E669 Obesity, unspecified: Secondary | ICD-10-CM | POA: Diagnosis present

## 2022-08-22 NOTE — Telephone Encounter (Signed)
Caryl Pina (surgery scheduler) from [facility] called the office to get this patient scheduled for a colonoscopy prior to his surgery on 09/22/22. He is Dr. Donneta Romberg pt so I scheduled him in the [place] on 09/21/22 at 8:30 am. Pt will receive instructions at his upcoming follow up appt with [facility].  Secure staff message sent to Vicie Mutters, PA-C so that she is aware.

## 2022-08-23 NOTE — Telephone Encounter (Signed)
Integrated Behavioral Health Case Management Referral Note  08/19/22 Name: JAGER KOSKA MRN: [id number] DOB: 09-Oct-1976 Elvera Lennox is a 46 y.o. year old male who sees Patient, No Pcp Per for primary care. LCSW was consulted to assess patient's needs and assist the patient with Financial Difficulties related to lack of health coverage .  Interpreter: No.   Interpreter Name & Language: none  Assessment: Patient experiencing financial difficulties due to lack of health coverage.  Intervention: CSW called patient and discussed financial assistance. 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 Per hospital financial counselor, patient not eligible for Medicaid. Per patient request, emailed information on [employer] and also included CAFA application.   Review of patient status, including review of consultants reports, relevant laboratory and other test results, and collaboration with appropriate care team members and the patient's provider was performed as part of comprehensive patient evaluation and provision of services.    Estanislado Emms, [facility] [phone]

## 2022-08-24 ENCOUNTER — Ambulatory Visit (INDEPENDENT_AMBULATORY_CARE_PROVIDER_SITE_OTHER): Payer: Self-pay | Admitting: Primary Care

## 2022-08-26 NOTE — Progress Notes (Unsigned)
08/29/2022 Roger Jennings [id number] August 19, 1976  Referring provider: Winferd Humphrey, PA-C Primary GI doctor: Dr. Rush Landmark  ASSESSMENT AND PLAN:   Diverticulitis of colon with complication, plan for laparoscopic sigmoid colon resection 12/14 Some mild discomfort since yesterday, no fevers no chills no nausea, no vomiting. We will give patient dicyclomine as needed for abdominal discomfort. If any worsening symptoms abdominal pain we will get CBC, CMET and sed rate.  Otherwise has preoperative labs scheduled for 12/7. In for colonoscopy 09/21/2022 with Dr. Rush Landmark. We have discussed the risks of bleeding, infection, perforation, medication reactions, and remote risk of death associated with colonoscopy. All questions were answered and the patient acknowledges these risk and wishes to proceed.  OBSTRUCTIVE SLEEP APNEA    Patient Care Team: Patient, No Pcp Per as PCP - General (General Practice) Mansouraty, Telford Nab., MD as Consulting Physician (Gastroenterology) Deneise Lever, MD as Consulting Physician (Pulmonary Disease) Charlotte Crumb, MD as Consulting Physician (Orthopedic Surgery) Michael Boston, MD as Consulting Physician (General Surgery)  HISTORY OF PRESENT ILLNESS: 46 y.o. male with a past medical history of OSA, history of diverticulitis 2020 and others listed below presents for evaluation of posthospital follow-up for diverticulitis.  2016 colonoscopy with Dr. Deatra Ina diverticulosis, internal hemorrhoids recall 10 years Hospitalized 08/05/2022 for diverticulitis with intramural phlegmon complicated by ileus. He with IV antibiotics Zosyn and transition to Augmentin 14 days and clear liquids. Patient has planned rectosigmoid colon resection with Dr. Johney Maine 09/22/2022. Been scheduled with Dr. Rush Landmark in the [facility] 09/21/2022.  Needs instructions.  He took some pepto today for AB discomfort, has some queasiness lower AB. States blamed it on popeyes  yesterday, was fine prior to that.  Denies diarrhea or constipation.  No nausea or vomiting.  No fever, chills.  Normally has bowel movement every day or every other day.   He  reports that he quit smoking about 20 years ago. His smoking use included cigarettes. He has never used smokeless tobacco. He reports that he does not drink alcohol and does not use drugs.  Current Medications:      Current Outpatient Medications (Analgesics):    allopurinol (ZYLOPRIM) 100 MG tablet, Take 2 tablets (200 mg total) by mouth daily.   acetaminophen (TYLENOL) 500 MG tablet, Take 1,000 mg by mouth every 6 (six) hours as needed for moderate pain. (Patient not taking: Reported on 08/29/2022)   Current Outpatient Medications (Other):    dicyclomine (BENTYL) 20 MG tablet, Take 1 tablet (20 mg total) by mouth 3 (three) times daily as needed for spasms.   PEG-KCl-NaCl-NaSulf-Na Asc-C (PLENVU) 140 g SOLR, Take 1 kit by mouth as directed. Use coupon: BIN: [id number] [facility]: [employer] Group: [id number] ID: [id number]   Medical History:  Past Medical History:  Diagnosis Date   Allergy    Anxiety    Diverticulitis    Diverticulosis    Gout    Internal hemorrhoids    Kidney stones    Nephrolithiasis    OSA on CPAP    Sleep apnea    Allergies:  Allergies  Allergen Reactions   Indomethacin Er Other (See Comments)    Gastrointestinal upset     Surgical History:  He  has a past surgical history that includes No past surgeries. Family History:  His family history includes Colon polyps in his father; Coronary artery disease in his paternal grandmother; Diabetes Mellitus II in his mother; Fibromyalgia in his mother; Hypertension in his father.  REVIEW OF SYSTEMS  : All  other systems reviewed and negative except where noted in the History of Present Illness.  PHYSICAL EXAM: BP 126/76   Pulse 73   Ht 5' 10" (1.778 m)   Wt 239 lb (108.4 kg)   BMI 34.29 kg/m  General:   Pleasant, well developed male in no  acute distress Head:   Normocephalic and atraumatic. Eyes:  sclerae anicteric,conjunctive pink  Heart:   regular rate and rhythm Pulm:  Clear anteriorly; no wheezing Abdomen:   Soft, Obese AB, Sluggish bowel sounds. mild tenderness in the lower abdomen. Without guarding and Without rebound, No organomegaly appreciated. Rectal: Not evaluated Extremities:  Without edema. Msk: Symmetrical without gross deformities. Peripheral pulses intact.  Neurologic:  Alert and  oriented x4;  No focal deficits.  Skin:   Dry and intact without significant lesions or rashes. Psychiatric:  Cooperative. Normal mood and affect.    Vladimir Crofts, PA-C 11:22 AM

## 2022-08-29 ENCOUNTER — Ambulatory Visit (INDEPENDENT_AMBULATORY_CARE_PROVIDER_SITE_OTHER): Payer: Self-pay | Admitting: Physician Assistant

## 2022-08-29 ENCOUNTER — Encounter: Payer: Self-pay | Admitting: Physician Assistant

## 2022-08-29 VITALS — BP 126/76 | HR 73 | Ht 70.0 in | Wt 239.0 lb

## 2022-08-29 DIAGNOSIS — G4733 Obstructive sleep apnea (adult) (pediatric): Secondary | ICD-10-CM

## 2022-08-29 DIAGNOSIS — K5732 Diverticulitis of large intestine without perforation or abscess without bleeding: Secondary | ICD-10-CM

## 2022-08-29 MED ORDER — DICYCLOMINE HCL 20 MG PO TABS: 20.0000 mg | ORAL_TABLET | Freq: Three times a day (TID) | ORAL | 0 refills | Status: DC | PRN

## 2022-08-29 MED ORDER — PLENVU 140 G PO SOLR: 1.0000 | ORAL | 0 refills | Status: DC

## 2022-08-29 NOTE — Patient Instructions (Addendum)
Can take dicyclomine at least 1-2 x a day for pain if needed.   Can do heating pad and can take tylenol max of '3000mg'$  a day.  Can add on lidocaine patches or voltern gel Go to the ER if unable to pass gas, severe AB pain, unable to hold down food, any shortness of breath of chest pain.   Diverticulitis Diverticulitis is inflammation or infection of small pouches in your colon that form when you have a condition called diverticulosis. The pouches in your colon are called diverticula. Your colon, or large intestine, is where water is absorbed and stool is formed. Complications of diverticulitis can include: Bleeding. Severe infection. Severe pain. Perforation of your colon. Obstruction of your colon.  What are the causes? Diverticulitis is caused by bacteria. Diverticulitis happens when stool becomes trapped in diverticula. This allows bacteria to grow in the diverticula, which can lead to inflammation and infection. What increases the risk? People with diverticulosis are at risk for diverticulitis. Eating a diet that does not include enough fiber from fruits and vegetables may make diverticulitis more likely to develop. What are the signs or symptoms? Symptoms of diverticulitis may include: Abdominal pain and tenderness. The pain is normally located on the left side of the abdomen, but may occur in other areas. Fever and chills. Bloating. Cramping. Nausea. Vomiting. Constipation. Diarrhea. Blood in your stool.  How is this diagnosed? Your health care provider will ask you about your medical history and do a physical exam. You may need to have tests done because many medical conditions can cause the same symptoms as diverticulitis. Tests may include: Blood tests. Urine tests. Imaging tests of the abdomen, including X-rays and CT scans.  When your condition is under control, your health care provider may recommend that you have a colonoscopy. A colonoscopy can show how severe  your diverticula are and whether something else is causing your symptoms. How is this treated? Most cases of diverticulitis are mild and can be treated at home. Treatment may include: Taking over-the-counter pain medicines. Following a clear liquid diet. Taking antibiotic medicines by mouth for 7-10 days.  More severe cases may be treated at a hospital. Treatment may include: Not eating or drinking. Taking prescription pain medicine. Receiving antibiotic medicines through an IV tube. Receiving fluids and nutrition through an IV tube. Surgery.  Follow these instructions at home: Follow your health care provider's instructions carefully. Follow a full liquid diet or other diet as directed by your health care provider. After your symptoms improve, your health care provider may tell you to change your diet. He or she may recommend you eat a high-fiber diet. Fruits and vegetables are good sources of fiber. Fiber makes it easier to pass stool. Take fiber supplements or probiotics as directed by your health care provider. Only take medicines as directed by your health care provider. Keep all your follow-up appointments. Contact a health care provider if: Your pain does not improve. You have a hard time eating food. Your bowel movements do not return to normal. Get help right away if: Your pain becomes worse. Your symptoms do not get better. Your symptoms suddenly get worse. You have a fever. You have repeated vomiting. You have bloody or black, tarry stools. This information is not intended to replace advice given to you by your health care provider. Make sure you discuss any questions you have with your health care provider. Document Released: 07/06/2005 Document Revised: 03/03/2016 Document Reviewed: 08/21/2013 Elsevier Interactive Patient Education  2017  [employer].

## 2022-09-08 ENCOUNTER — Telehealth: Payer: Self-pay | Admitting: Clinical

## 2022-09-09 NOTE — Telephone Encounter (Signed)
Integrated Behavioral Health Case Management Referral Note  08/19/22 Name: Roger Jennings MRN: [id number] DOB: March 13, 1976 Roger Jennings is a 46 y.o. year old male who sees Patient, No Pcp Per for primary care. LCSW was consulted to assess patient's needs and assist the patient with Financial Difficulties related to lack of health coverage .  Interpreter: No.   Interpreter Name & Language: none  Assessment: Patient experiencing financial difficulties due to lack of health coverage.  Intervention: Patient called with questions about documents for his CAFA application. Discussed this with patient. Patient to contact CSW when he completes application and has all documents.  Review of patient status, including review of consultants reports, relevant laboratory and other test results, and collaboration with appropriate care team members and the patient's provider was performed as part of comprehensive patient evaluation and provision of services.    Estanislado Emms, [facility] [phone]

## 2022-09-12 ENCOUNTER — Other Ambulatory Visit: Payer: Self-pay | Admitting: Podiatry

## 2022-09-14 NOTE — Progress Notes (Signed)
COVID Vaccine received:  _0  No _1  Yes Date of any COVID positive Test in last 90 days: None  PCP -  none    Has no PCP (I have given him the information for [facility]) He voiced understanding that he needs to have a PCP to monitor him.   Cardiologist - none  Chest x-ray -  EKG -  will do at PST 09-15-2022 Stress Test -  ECHO -  Cardiac Cath -   PCR screen: _2  Ordered & Completed                      _3   No Order but Needs PROFEND                      _4   N/A for this surgery  Surgery Plan:  _5  Ambulatory                            _6  Outpatient in bed                            _7  Admit  Anesthesia:    _8  General  _9  Spinal                           _10   Choice _11   MAC  Bowel Prep - _12  No  _13   Yes    Dulcolax, Neomycin etc.  Pacemaker / ICD device _14  No _15  Yes        Device order form faxed _16  No    _17   Yes      Faxed to:  Spinal Cord Stimulator:_18  No _19  Yes      (Remind patient to bring remote DOS) Other Implants:   History of Sleep Apnea? _20  No _21  Yes   CPAP used?- _22  No _23  Yes    Does the patient monitor blood sugar? _24  No _25  Yes  _26  N/A Does patient have a [employer] or Dexacom? _27  No _28  Yes   Fasting Blood Sugar Ranges-  Checks Blood Sugar __0___ times a day HGB A1c - 6.9  on 08-18-2022   Blood Thinner / Instructions:  none Aspirin Instructions:none  ERAS Protocol Ordered: _29  No  _30  Yes PRE-SURGERY _31  ENSURE  _32  G2  x3 Patient is to be NPO after: 09:00am  Comments: Preop Ostomy Marking ordered- completed by Stacy Gardner, RN  Activity level: Patient can climb a flight of stairs without difficulty; _33  No CP  _34  No SOB. Patient can perform ADLs without assistance.   Anesthesia review: OSA (CPAP), anxiety, gout, Pre-DM (diet)   Patient denies shortness of breath, fever, cough and chest pain at PAT appointment.  Patient verbalized understanding and agreement to the Pre-Surgical Instructions that were  given to them at this PAT appointment. Patient was also educated of the need to review these PAT instructions again prior to his/her surgery.I reviewed the appropriate phone numbers to call if they have any and questions or concerns.

## 2022-09-14 NOTE — Patient Instructions (Addendum)
SURGICAL WAITING ROOM VISITATION Patients having surgery or a procedure may have no more than 2 support people in the waiting area - these visitors may rotate in the visitor waiting room.   Children under the age of 15 must have an adult with them who is not the patient. If the patient needs to stay at the hospital during part of their recovery, the visitor guidelines for inpatient rooms apply.  PRE-OP VISITATION  Pre-op nurse will coordinate an appropriate time for 1 support person to accompany the patient in pre-op.  This support person may not rotate.  This visitor will be contacted when the time is appropriate for the visitor to come back in the pre-op area.  Please refer to the [facility] website for the visitor guidelines for Inpatients (after your surgery is over and you are in a regular room).  You are not required to quarantine at this time prior to your surgery. However, you must do this: Hand Hygiene often Do NOT share personal items Notify your provider if you are in close contact with someone who has COVID or you develop fever 100.4 or greater, new onset of sneezing, cough, sore throat, shortness of breath or body aches.  If you test positive for Covid or have been in contact with anyone that has tested positive in the last 10 days please notify you surgeon.    Your procedure is scheduled on:  Thursday  September 22, 2022  Report to [facility] Main Entrance: [place] entrance where the [employer] is available.   Report to admitting at: 09:45 AM  +++++Call this number if you have any questions or problems the morning of surgery [phone]  FOLLOW BOWEL PREP AND ANY ADDITIONAL PRE OP INSTRUCTIONS YOU RECEIVED FROM YOUR SURGEON'S OFFICE!!!  Dulcolax 20 mg - Take one (1) tablets with water at 07:00 am the day prior to surgery.  Miralax 255 g - Mix with 64 oz Gatorade/Powerade.  Starting at 10:00 am ,Drink this gradually over the next few hours (8 oz glass every  15-30 minutes) until gone the day prior to surgery You should finish in 4 hours-6 hours.    Neomycin 1000 mg - At 2 pm, 3 pm and 10 pm after Miralax  bowel prep the day prior to surgery.  Metronidazole 1000 mg - At 2 pm, 3 pm and 10 pm after Miralax bowel prep the day prior to surgery.   Drink plenty of clear liquids all evening to avoid getting dehydrated.   Do not eat food after Midnight the night prior to your surgery/procedure.  After Midnight you may have the following liquids until 09:00 AM  DAY OF SURGERY  Clear Liquid Diet Water Black Coffee (sugar ok, NO MILK/CREAM OR CREAMERS)  Tea (sugar ok, NO MILK/CREAM OR CREAMERS) regular and decaf                             Plain Jell-O  with no fruit (NO RED)                                           Fruit ices (not with fruit pulp, NO RED)  Popsicles (NO RED)                                                                  Juice: apple, WHITE grape, WHITE cranberry Sports drinks like Gatorade or Powerade (NO RED)       DRINK two (2) bottles of Pre-Surgery G2 starting at 6:00 pm the evening prior to your surgery to help prevent dehydration. Increase drinking clear fluids (see list above)                     The day of surgery:  Drink ONE (1) Pre-Surgery  G2 at  09:00 AM the morning of surgery. Drink in one sitting. Do not sip.  This drink was given to you during your hospital pre-op appointment visit. Nothing else to drink after completing the Pre-Surgery  G2 : No candy, chewing gum or throat lozenges.   Oral Hygiene is also important to reduce your risk of infection.        Remember - BRUSH YOUR TEETH THE MORNING OF SURGERY WITH YOUR REGULAR TOOTHPASTE  Do NOT smoke after Midnight the night before surgery.  Take ONLY these medicines the morning of surgery with A SIP OF WATER:  Allopurinol (Zyloprim)  If You have been diagnosed with Sleep Apnea - Bring CPAP mask and tubing day of surgery. We  will provide you with a CPAP machine on the day of your surgery.                   You may not have any metal on your body including  jewelry, and body piercing  Do not wear  lotions, powders,  cologne, or deodorant  Men may shave face and neck.  Contacts, Hearing Aids, dentures or bridgework may not be worn into surgery. DENTURES WILL BE REMOVED PRIOR TO SURGERY PLEASE DO NOT APPLY "Poly grip" OR ADHESIVES!!!  You may bring a small overnight bag with you on the day of surgery, only pack items that are not valuable .[place] [facility] IS NOT RESPONSIBLE   FOR VALUABLES THAT ARE LOST OR STOLEN.   Do not bring your home medications to the hospital. The Pharmacy will dispense medications listed on your medication list to you during your admission in the Hospital.  Special Instructions: Bring a copy of your healthcare power of attorney and living will documents the day of surgery, if you wish to have them scanned into your [facility] Records- EPIC  Please read over the following fact sheets you were given: IF YOU HAVE QUESTIONS ABOUT YOUR PRE-OP INSTRUCTIONS, PLEASE CALL [phone]  ([place])   [place] - Preparing for Surgery Before surgery, you can play an important role.  Because skin is not sterile, your skin needs to be as free of germs as possible.  You can reduce the number of germs on your skin by washing with CHG (chlorahexidine gluconate) soap before surgery.  CHG is an antiseptic cleaner which kills germs and bonds with the skin to continue killing germs even after washing. Please DO NOT use if you have an allergy to CHG or antibacterial soaps.  If your skin becomes reddened/irritated stop using the CHG and inform your nurse when you arrive at Short Stay. Do not shave (including legs  and underarms) for at least 48 hours prior to the first CHG shower.  You may shave your face/neck.  Please follow these instructions carefully:  1.  Shower with CHG Soap the night before surgery and the   morning of surgery.  2.  If you choose to wash your hair, wash your hair first as usual with your normal  shampoo.  3.  After you shampoo, rinse your hair and body thoroughly to remove the shampoo.                             4.  Use CHG as you would any other liquid soap.  You can apply chg directly to the skin and wash.  Gently with a scrungie or clean washcloth.  5.  Apply the CHG Soap to your body ONLY FROM THE NECK DOWN.   Do not use on face/ open                           Wound or open sores. Avoid contact with eyes, ears mouth and genitals (private parts).                       Wash face,  Genitals (private parts) with your normal soap.             6.  Wash thoroughly, paying special attention to the area where your  surgery  will be performed.  7.  Thoroughly rinse your body with warm water from the neck down.  8.  DO NOT shower/wash with your normal soap after using and rinsing off the CHG Soap.            9.  Pat yourself dry with a clean towel.            10.  Wear clean pajamas.            11.  Place clean sheets on your bed the night of your first shower and do not  sleep with pets.  ON THE DAY OF SURGERY : Do not apply any lotions/deodorants the morning of surgery.  Please wear clean clothes to the hospital/surgery center.    FAILURE TO FOLLOW THESE INSTRUCTIONS MAY RESULT IN THE CANCELLATION OF YOUR SURGERY  PATIENT SIGNATURE_________________________________  NURSE SIGNATURE__________________________________  ________________________________________________________________________       Adam Phenix    An incentive spirometer is a tool that can help keep your lungs clear and active. This tool measures how well you are filling your lungs with each breath. Taking long deep breaths may help reverse or decrease the chance of developing breathing (pulmonary) problems (especially infection) following: A long period of time when you are unable to move or be  active. BEFORE THE PROCEDURE  If the spirometer includes an indicator to show your best effort, your nurse or respiratory therapist will set it to a desired goal. If possible, sit up straight or lean slightly forward. Try not to slouch. Hold the incentive spirometer in an upright position. INSTRUCTIONS FOR USE  Sit on the edge of your bed if possible, or sit up as far as you can in bed or on a chair. Hold the incentive spirometer in an upright position. Breathe out normally. Place the mouthpiece in your mouth and seal your lips tightly around it. Breathe in slowly and as deeply as possible, raising the piston or the ball toward the  top of the column. Hold your breath for 3-5 seconds or for as long as possible. Allow the piston or ball to fall to the bottom of the column. Remove the mouthpiece from your mouth and breathe out normally. Rest for a few seconds and repeat Steps 1 through 7 at least 10 times every 1-2 hours when you are awake. Take your time and take a few normal breaths between deep breaths. The spirometer may include an indicator to show your best effort. Use the indicator as a goal to work toward during each repetition. After each set of 10 deep breaths, practice coughing to be sure your lungs are clear. If you have an incision (the cut made at the time of surgery), support your incision when coughing by placing a pillow or rolled up towels firmly against it. Once you are able to get out of bed, walk around indoors and cough well. You may stop using the incentive spirometer when instructed by your caregiver.  RISKS AND COMPLICATIONS Take your time so you do not get dizzy or light-headed. If you are in pain, you may need to take or ask for pain medication before doing incentive spirometry. It is harder to take a deep breath if you are having pain. AFTER USE Rest and breathe slowly and easily. It can be helpful to keep track of a log of your progress. Your caregiver can provide you  with a simple table to help with this. If you are using the spirometer at home, follow these instructions: [place] IF:  You are having difficultly using the spirometer. You have trouble using the spirometer as often as instructed. Your pain medication is not giving enough relief while using the spirometer. You develop fever of 100.5 F (38.1 C) or higher.                                                                                                    SEEK IMMEDIATE MEDICAL CARE IF:  You cough up bloody sputum that had not been present before. You develop fever of 102 F (38.9 C) or greater. You develop worsening pain at or near the incision site. MAKE SURE YOU:  Understand these instructions. Will watch your condition. Will get help right away if you are not doing well or get worse. Document Released: 02/06/2007 Document Revised: 12/19/2011 Document Reviewed: 04/09/2007 [facility] Patient Information 2014 [place], [place].     WHAT IS A BLOOD TRANSFUSION? Blood Transfusion Information  A transfusion is the replacement of blood or some of its parts. Blood is made up of multiple cells which provide different functions. Red blood cells carry oxygen and are used for blood loss replacement. White blood cells fight against infection. Platelets control bleeding. Plasma helps clot blood. Other blood products are available for specialized needs, such as hemophilia or other clotting disorders. BEFORE THE TRANSFUSION  Who gives blood for transfusions?  Healthy volunteers who are fully evaluated to make sure their blood is safe. This is blood bank blood. Transfusion therapy is the safest it has ever been in the practice of medicine. Before  blood is taken from a donor, a complete history is taken to make sure that person has no history of diseases nor engages in risky social behavior (examples are intravenous drug use or sexual activity with multiple partners). The donor's travel  history is screened to minimize risk of transmitting infections, such as malaria. The donated blood is tested for signs of infectious diseases, such as HIV and hepatitis. The blood is then tested to be sure it is compatible with you in order to minimize the chance of a transfusion reaction. If you or a relative donates blood, this is often done in anticipation of surgery and is not appropriate for emergency situations. It takes many days to process the donated blood. RISKS AND COMPLICATIONS Although transfusion therapy is very safe and saves many lives, the main dangers of transfusion include:  Getting an infectious disease. Developing a transfusion reaction. This is an allergic reaction to something in the blood you were given. Every precaution is taken to prevent this. The decision to have a blood transfusion has been considered carefully by your caregiver before blood is given. Blood is not given unless the benefits outweigh the risks. AFTER THE TRANSFUSION Right after receiving a blood transfusion, you will usually feel much better and more energetic. This is especially true if your red blood cells have gotten low (anemic). The transfusion raises the level of the red blood cells which carry oxygen, and this usually causes an energy increase. The nurse administering the transfusion will monitor you carefully for complications. HOME CARE INSTRUCTIONS  No special instructions are needed after a transfusion. You may find your energy is better. Speak with your caregiver about any limitations on activity for underlying diseases you may have. SEEK MEDICAL CARE IF:  Your condition is not improving after your transfusion. You develop redness or irritation at the intravenous (IV) site. SEEK IMMEDIATE MEDICAL CARE IF:  Any of the following symptoms occur over the next 12 hours: Shaking chills. You have a temperature by mouth above 102 F (38.9 C), not controlled by medicine. Chest, back, or muscle  pain. People around you feel you are not acting correctly or are confused. Shortness of breath or difficulty breathing. Dizziness and fainting. You get a rash or develop hives. You have a decrease in urine output. Your urine turns a dark color or changes to pink, red, or brown. Any of the following symptoms occur over the next 10 days: You have a temperature by mouth above 102 F (38.9 C), not controlled by medicine. Shortness of breath. Weakness after normal activity. The white part of the eye turns yellow (jaundice). You have a decrease in the amount of urine or are urinating less often. Your urine turns a dark color or changes to pink, red, or brown. Document Released: 09/23/2000 Document Revised: 12/19/2011 Document Reviewed: 05/12/2008 [facility] Patient Information 2014 [place], [place].  _______________________________________________________________________

## 2022-09-15 ENCOUNTER — Encounter (HOSPITAL_COMMUNITY): Payer: Self-pay

## 2022-09-15 ENCOUNTER — Other Ambulatory Visit: Payer: Self-pay | Admitting: Urology

## 2022-09-15 ENCOUNTER — Telehealth: Payer: Self-pay | Admitting: *Deleted

## 2022-09-15 ENCOUNTER — Encounter (HOSPITAL_COMMUNITY)
Admission: RE | Admit: 2022-09-15 | Discharge: 2022-09-15 | Disposition: A | Discharge status: Home / Self Care | Payer: Self-pay | Source: Ambulatory Visit | Attending: Surgery | Admitting: Surgery

## 2022-09-15 ENCOUNTER — Other Ambulatory Visit: Payer: Self-pay

## 2022-09-15 VITALS — BP 129/85 | HR 74 | Temp 98.6°F | Resp 18 | Ht 70.0 in | Wt 235.6 lb

## 2022-09-15 DIAGNOSIS — R7303 Prediabetes: Secondary | ICD-10-CM | POA: Insufficient documentation

## 2022-09-15 DIAGNOSIS — Z01818 Encounter for other preprocedural examination: Secondary | ICD-10-CM | POA: Insufficient documentation

## 2022-09-15 HISTORY — DX: Prediabetes: R73.03

## 2022-09-15 HISTORY — DX: Personal history of urinary calculi: Z87.442

## 2022-09-15 LAB — BASIC METABOLIC PANEL
Anion gap: 8 (ref 5–15)
BUN: 16 mg/dL (ref 6–20)
CO2: 25 mmol/L (ref 22–32)
Calcium: 9.2 mg/dL (ref 8.9–10.3)
Chloride: 105 mmol/L (ref 98–111)
Creatinine, Ser: 1.1 mg/dL (ref 0.61–1.24)
GFR, Estimated: >60 mL/min (ref >60)
Glucose, Bld: 81 mg/dL (ref 70–99)
Potassium: 4.1 mmol/L (ref 3.5–5.1)
Sodium: 138 mmol/L (ref 135–145)

## 2022-09-15 LAB — CBC
HCT: 46.4 % (ref 39.0–52.0)
Hemoglobin: 15.1 g/dL (ref 13.0–17.0)
MCH: 30.3 pg (ref 26.0–34.0)
MCHC: 32.5 g/dL (ref 30.0–36.0)
MCV: 93 fL (ref 80.0–100.0)
Platelets: 187 10*3/uL (ref 150–400)
RBC: 4.99 MIL/uL (ref 4.22–5.81)
RDW: 13.7 % (ref 11.5–15.5)
WBC: 6.8 10*3/uL (ref 4.0–10.5)
nRBC: 0 % (ref 0.0–0.2)

## 2022-09-15 LAB — GLUCOSE, CAPILLARY: Glucose-Capillary: 89 mg/dL (ref 70–99)

## 2022-09-15 NOTE — Consult Note (Signed)
[place] Nurse ostomy consult note [place] Nurse requested for preoperative stoma site marking by Dr. Johney [place].  Discussed surgical procedure and stoma creation with patient.  Explained role of the [place] nurse team.  Answered patient questions.   Examined patient sitting and standing in order to place the marking in the patient's visual field, away from any creases or abdominal contour issues and within the rectus muscle.  Attempted to mark below the patient's belt line, but he wears his slacks and belt low on the abdomen.   Marked for colostomy in the LLQ  8cm to the left of the umbilicus and 3.2RJ above the umbilicus.  Marked for ileostomy in the RLQ  5.5cm to the right of the umbilicus and   2 cm above the umbilicus.   Patient's abdomen cleansed with CHG wipes at site markings, allowed to air dry prior to marking. Covered marks with thin film transparent dressing to preserve mark until date of surgery.   [place] Nurse team will follow up with patient after surgery for continue ostomy care and teaching should a stoma be necessary intraoperatively.   Thank you for inviting [place] to participate in this patient's Plan of Care.  Maudie Flakes, MSN, RN, CNS, [place], Serita Grammes, [employer], [employer] phone:  [phone]

## 2022-09-15 NOTE — Telephone Encounter (Signed)
Pt came in the office today to drop financial paperwork assistant and placed in [place] office.

## 2022-09-21 ENCOUNTER — Encounter: Payer: Self-pay | Admitting: Gastroenterology

## 2022-09-21 ENCOUNTER — Ambulatory Visit (AMBULATORY_SURGERY_CENTER): Payer: Self-pay | Admitting: Gastroenterology

## 2022-09-21 VITALS — BP 102/71 | HR 67 | Temp 97.3°F | Resp 11 | Ht 70.0 in | Wt 239.0 lb

## 2022-09-21 DIAGNOSIS — K635 Polyp of colon: Secondary | ICD-10-CM

## 2022-09-21 DIAGNOSIS — K5732 Diverticulitis of large intestine without perforation or abscess without bleeding: Secondary | ICD-10-CM

## 2022-09-21 DIAGNOSIS — K649 Unspecified hemorrhoids: Secondary | ICD-10-CM

## 2022-09-21 DIAGNOSIS — D127 Benign neoplasm of rectosigmoid junction: Secondary | ICD-10-CM

## 2022-09-21 MED ORDER — SODIUM CHLORIDE 0.9 % IV SOLN: 500.0000 mL | Freq: Once | INTRAVENOUS | Status: DC

## 2022-09-21 NOTE — Progress Notes (Signed)
Pt's states no medical or surgical changes since previsit or office visit. 

## 2022-09-21 NOTE — Progress Notes (Signed)
Called to room to assist during endoscopic procedure.  Patient ID and intended procedure confirmed with present staff. Received instructions for my participation in the procedure from the performing physician.  

## 2022-09-21 NOTE — Progress Notes (Unsigned)
GASTROENTEROLOGY PROCEDURE H&P NOTE   Primary Care Physician: Patient, No Pcp Per  HPI: Roger Jennings is a 46 y.o. male who presents for Colonoscopy for screening and history of previous diverticulitis.  Past Medical History:  Diagnosis Date   Allergy    Anxiety    Diverticulitis    Diverticulosis    Gout    History of kidney stones    Internal hemorrhoids    Kidney stones    Nephrolithiasis    OSA on CPAP    Pre-diabetes    Sleep apnea    Past Surgical History:  Procedure Laterality Date   NO PAST SURGERIES     Current Outpatient Medications  Medication Sig Dispense Refill   allopurinol (ZYLOPRIM) 100 MG tablet TAKE 2 TABLETS BY MOUTH EVERY DAY 180 tablet 0   acetaminophen (TYLENOL) 500 MG tablet Take 1,000 mg by mouth every 6 (six) hours as needed for moderate pain.     dicyclomine (BENTYL) 20 MG tablet Take 1 tablet (20 mg total) by mouth 3 (three) times daily as needed for spasms. (Patient not taking: Reported on 09/12/2022) 20 tablet 0   Current Facility-Administered Medications  Medication Dose Route Frequency Provider Last Rate Last Admin   0.9 %  sodium chloride infusion  500 mL Intravenous Once Mansouraty, Telford Nab., MD        Current Outpatient Medications:    allopurinol (ZYLOPRIM) 100 MG tablet, TAKE 2 TABLETS BY MOUTH EVERY DAY, Disp: 180 tablet, Rfl: 0   acetaminophen (TYLENOL) 500 MG tablet, Take 1,000 mg by mouth every 6 (six) hours as needed for moderate pain., Disp: , Rfl:    dicyclomine (BENTYL) 20 MG tablet, Take 1 tablet (20 mg total) by mouth 3 (three) times daily as needed for spasms. (Patient not taking: Reported on 09/12/2022), Disp: 20 tablet, Rfl: 0  Current Facility-Administered Medications:    0.9 %  sodium chloride infusion, 500 mL, Intravenous, Once, Mansouraty, Telford Nab., MD Allergies  Allergen Reactions   Indomethacin Er Other (See Comments)    Gastrointestinal upset   Family History  Problem Relation Age of Onset   Diabetes  Mellitus II Mother    Fibromyalgia Mother    Hypertension Father    Colon polyps Father    Coronary artery disease Paternal Grandmother    Colon cancer Neg Hx    Esophageal cancer Neg Hx    Gallbladder disease Neg Hx    Rectal cancer Neg Hx    Stomach cancer Neg Hx    Inflammatory bowel disease Neg Hx    Liver disease Neg Hx    Pancreatic cancer Neg Hx    Social History   Socioeconomic History   Marital status: Single    Spouse name: Not on file   Number of children: Not on file   Years of education: Not on file   Highest education level: Not on file  Occupational History   Occupation: employed    Comment: [employer]  Tobacco Use   Smoking status: Former    Types: Cigarettes    Quit date: 10/10/2001    Years since quitting: 20.9   Smokeless tobacco: Never  Vaping Use   Vaping Use: Never used  Substance and Sexual Activity   Alcohol use: No    Alcohol/week: 0.0 standard drinks of alcohol    Comment: rare   Drug use: No   Sexual activity: Yes  Other Topics Concern   Not on file  Social History Narrative  Not on file   Social Determinants of Health   Financial Resource Strain: Not on file  Food Insecurity: Not on file  Transportation Needs: Not on file  Physical Activity: Not on file  Stress: Not on file  Social Connections: Not on file  Intimate Partner Violence: Not on file    Physical Exam: Today's Vitals   09/21/22 0733 09/21/22 0740  BP: 138/78   Pulse: 76   Temp:  (!) 97.3 F (36.3 C)  SpO2: 98%   Weight: 239 lb (108.4 kg)   Height: '5\' 10"'$  (1.778 m)    Body mass index is 34.29 kg/m. GEN: NAD EYE: Sclerae anicteric ENT: MMM CV: Non-tachycardic GI: Soft, NT/ND NEURO:  Alert & Oriented x 3  Lab Results: No results for input(s): "WBC", "HGB", "HCT", "PLT" in the last 72 hours. BMET No results for input(s): "NA", "K", "CL", "CO2", "GLUCOSE", "BUN", "CREATININE", "CALCIUM" in the last 72 hours. LFT No results for input(s): "PROT",  "ALBUMIN", "AST", "ALT", "ALKPHOS", "BILITOT", "BILIDIR", "IBILI" in the last 72 hours. PT/INR No results for input(s): "LABPROT", "INR" in the last 72 hours.   Impression / Plan: This is a 46 y.o.male who presents for Colonoscopy for screening and history of previous diverticulitis.  The risks and benefits of endoscopic evaluation/treatment were discussed with the patient and/or family; these include but are not limited to the risk of perforation, infection, bleeding, missed lesions, lack of diagnosis, severe illness requiring hospitalization, as well as anesthesia and sedation related illnesses.  The patient's history has been reviewed, patient examined, no change in status, and deemed stable for procedure.  The patient and/or family is agreeable to proceed.    Justice Britain, MD [facility] Advanced Endoscopy Office # [phone]

## 2022-09-21 NOTE — Patient Instructions (Signed)
Please read handouts provided. Maintain clear liquid diet until surgery tomorrow. Nothing to eat or drink after midnight. High Fiber diet after surgery. Continue present medications. Await pathology results. FiberCon 1-2 tablets daily in the future.   YOU HAD AN ENDOSCOPIC PROCEDURE TODAY AT [facility]:   Refer to the procedure report that was given to you for any specific questions about what was found during the examination.  If the procedure report does not answer your questions, please call your gastroenterologist to clarify.  If you requested that your care partner not be given the details of your procedure findings, then the procedure report has been included in a sealed envelope for you to review at your convenience later.  YOU SHOULD EXPECT: Some feelings of bloating in the abdomen. Passage of more gas than usual.  Walking can help get rid of the air that was put into your GI tract during the procedure and reduce the bloating. If you had a lower endoscopy (such as a colonoscopy or flexible sigmoidoscopy) you may notice spotting of blood in your stool or on the toilet paper. If you underwent a bowel prep for your procedure, you may not have a normal bowel movement for a few days.  Please Note:  You might notice some irritation and congestion in your nose or some drainage.  This is from the oxygen used during your procedure.  There is no need for concern and it should clear up in a day or so.  SYMPTOMS TO REPORT IMMEDIATELY:  Following lower endoscopy (colonoscopy or flexible sigmoidoscopy):  Excessive amounts of blood in the stool  Significant tenderness or worsening of abdominal pains  Swelling of the abdomen that is new, acute  Fever of 100F or higher  For urgent or emergent issues, a gastroenterologist can be reached at any hour by calling [phone]. Do not use MyChart messaging for urgent concerns.    DIET:   Drink plenty of fluids but you should avoid  alcoholic beverages for 24 hours.  ACTIVITY:  You should plan to take it easy for the rest of today and you should NOT DRIVE or use heavy machinery until tomorrow (because of the sedation medicines used during the test).    FOLLOW UP: Our staff will call the number listed on your records the next business day following your procedure.  We will call around 7:15- 8:00 am to check on you and address any questions or concerns that you may have regarding the information given to you following your procedure. If we do not reach you, we will leave a message.     If any biopsies were taken you will be contacted by phone or by letter within the next 1-3 weeks.  Please call us at [phone] if you have not heard about the biopsies in 3 weeks.    SIGNATURES/CONFIDENTIALITY: You and/or your care partner have signed paperwork which will be entered into your electronic medical record.  These signatures attest to the fact that that the information above on your After Visit Summary has been reviewed and is understood.  Full responsibility of the confidentiality of this discharge information lies with you and/or your care-partner.

## 2022-09-21 NOTE — Progress Notes (Unsigned)
Report to PACU, RN, vss, BBS= Clear.  

## 2022-09-21 NOTE — Op Note (Signed)
[facility] Patient Name: Roger Jennings Procedure Date: 09/21/2022 7:47 AM MRN: [id number] Endoscopist: Justice Britain , MD, [id number] Age: 46 Referring MD:  Date of Birth: [birth year] Gender: Male Account #: [id number] Procedure:                Colonoscopy Indications:              Screening for colorectal malignant neoplasm,                            Incidental - Abnormal CT of the GI tract,                            Incidental - Follow-up of diverticulitis Medicines:                Monitored Anesthesia Care Procedure:                Pre-Anesthesia Assessment:                           - Prior to the procedure, a History and Physical                            was performed, and patient medications and                            allergies were reviewed. The patient's tolerance of                            previous anesthesia was also reviewed. The risks                            and benefits of the procedure and the sedation                            options and risks were discussed with the patient.                            All questions were answered, and informed consent                            was obtained. Prior Anticoagulants: The patient has                            taken no anticoagulant or antiplatelet agents. ASA                            Grade Assessment: II - A patient with mild systemic                            disease. After reviewing the risks and benefits,                            the patient was deemed in satisfactory condition to  undergo the procedure.                           After obtaining informed consent, the colonoscope                            was passed under direct vision. Throughout the                            procedure, the patient's blood pressure, pulse, and                            oxygen saturations were monitored continuously. The                            Olympus CF-HQ190L (Serial#  [id number]) Colonoscope was                            introduced through the anus and advanced to the 5                            cm into the ileum. The colonoscopy was performed                            without difficulty. The patient tolerated the                            procedure. The quality of the bowel preparation was                            adequate. The terminal ileum, ileocecal valve,                            appendiceal orifice, and rectum were photographed. Scope In: 8:06:27 AM Scope Out: 8:19:23 AM Scope Withdrawal Time: 0 hours 10 minutes 51 seconds  Total Procedure Duration: 0 hours 12 minutes 56 seconds  Findings:                 The digital rectal exam findings include                            hemorrhoids. Pertinent negatives include no                            palpable rectal lesions.                           The terminal ileum and ileocecal valve appeared                            normal.                           A single small-mouthed diverticulum was found in  the hepatic flexure.                           A 4 mm polyp was found in the recto-sigmoid colon.                            The polyp was sessile. The polyp was removed with a                            cold snare. Resection and retrieval were complete.                           Multiple small-mouthed diverticula were found in                            the recto-sigmoid colon, sigmoid colon and                            descending colon.                           Area of moderately congested mucosa was found in                            the recto-sigmoid colon and in the sigmoid colon                            (20-30 cm). Biopsies were taken with a cold forceps                            for histology.                           Normal mucosa was found in the entire colon                            otherwise.                           Non-bleeding non-thrombosed  external and internal                            hemorrhoids were found during retroflexion, during                            perianal exam and during digital exam. The                            hemorrhoids were Grade III (internal hemorrhoids                            that prolapse but require manual reduction). Complications:            No immediate complications. Estimated Blood Loss:     Estimated blood loss was minimal. Impression:               -  Hemorrhoids found on digital rectal exam.                           - The examined portion of the ileum was normal.                           - Diverticula at the hepatic flexure.                           - One 4 mm polyp at the recto-sigmoid colon,                            removed with a cold snare. Resected and retrieved.                           - Diverticulosis in the recto-sigmoid colon, in the                            sigmoid colon and in the descending colon.                           - Congested mucosa in the recto-sigmoid colon and                            in the sigmoid colon (20-30 cm). Biopsied.                           - Normal mucosa in the entire examined colon                            otherwise.                           - Non-bleeding non-thrombosed external and internal                            hemorrhoids. Recommendation:           - The patient will be observed post-procedure,                            until all discharge criteria are met.                           - Discharge patient to home.                           - Patient has a contact number available for                            emergencies. The signs and symptoms of potential                            delayed complications were discussed with the  patient. Return to normal activities tomorrow.                            Written discharge instructions were provided to the                            patient.                            - Maintain clear liquid diet until your procedure                            with Colorectal surgery tomorrow. NPO at midnight.                            High fiber diet is recommended after you have                            healed from your surgery.                           - Use FiberCon 1-2 tablets PO daily in the future.                           - Continue present medications.                           - Await pathology results.                           - Repeat colonoscopy in 5-10 years for surveillance                            based on pathology results (unless there is                            evidence of chronic colitis present on the biopsies                            that may require earlier followup).                           - The findings and recommendations were discussed                            with the patient.                           - The findings and recommendations were discussed                            with the patient's family. Justice Britain, MD 09/21/2022 8:33:25 AM

## 2022-09-22 ENCOUNTER — Encounter (HOSPITAL_COMMUNITY): Payer: Self-pay | Admitting: Surgery

## 2022-09-22 ENCOUNTER — Telehealth: Payer: Self-pay

## 2022-09-22 ENCOUNTER — Other Ambulatory Visit: Payer: Self-pay

## 2022-09-22 ENCOUNTER — Encounter (HOSPITAL_COMMUNITY)
Admission: RE | Disposition: A | Discharge status: Home / Self Care | Payer: Self-pay | Source: Ambulatory Visit | Attending: Surgery

## 2022-09-22 ENCOUNTER — Inpatient Hospital Stay (HOSPITAL_COMMUNITY): Payer: Self-pay | Admitting: Registered Nurse

## 2022-09-22 ENCOUNTER — Inpatient Hospital Stay (HOSPITAL_COMMUNITY)
Admission: RE | Admit: 2022-09-22 | Discharge: 2022-09-25 | DRG: 329 | Disposition: A | Discharge status: Home / Self Care | Payer: Self-pay | Source: Ambulatory Visit | Attending: Surgery | Admitting: Surgery

## 2022-09-22 DIAGNOSIS — R739 Hyperglycemia, unspecified: Secondary | ICD-10-CM

## 2022-09-22 DIAGNOSIS — K5720 Diverticulitis of large intestine with perforation and abscess without bleeding: Principal | ICD-10-CM | POA: Diagnosis present

## 2022-09-22 DIAGNOSIS — M109 Gout, unspecified: Secondary | ICD-10-CM

## 2022-09-22 DIAGNOSIS — N200 Calculus of kidney: Secondary | ICD-10-CM

## 2022-09-22 DIAGNOSIS — G4733 Obstructive sleep apnea (adult) (pediatric): Secondary | ICD-10-CM

## 2022-09-22 DIAGNOSIS — T884XXA Failed or difficult intubation, initial encounter: Secondary | ICD-10-CM | POA: Insufficient documentation

## 2022-09-22 DIAGNOSIS — Z87442 Personal history of urinary calculi: Secondary | ICD-10-CM

## 2022-09-22 DIAGNOSIS — Z8249 Family history of ischemic heart disease and other diseases of the circulatory system: Secondary | ICD-10-CM

## 2022-09-22 DIAGNOSIS — K651 Peritoneal abscess: Secondary | ICD-10-CM | POA: Diagnosis present

## 2022-09-22 DIAGNOSIS — R7303 Prediabetes: Secondary | ICD-10-CM

## 2022-09-22 DIAGNOSIS — K5732 Diverticulitis of large intestine without perforation or abscess without bleeding: Secondary | ICD-10-CM | POA: Diagnosis present

## 2022-09-22 DIAGNOSIS — Z833 Family history of diabetes mellitus: Secondary | ICD-10-CM

## 2022-09-22 DIAGNOSIS — Z01818 Encounter for other preprocedural examination: Secondary | ICD-10-CM

## 2022-09-22 DIAGNOSIS — E669 Obesity, unspecified: Secondary | ICD-10-CM | POA: Diagnosis present

## 2022-09-22 DIAGNOSIS — Z6834 Body mass index (BMI) 34.0-34.9, adult: Secondary | ICD-10-CM

## 2022-09-22 DIAGNOSIS — Z87891 Personal history of nicotine dependence: Secondary | ICD-10-CM

## 2022-09-22 DIAGNOSIS — Z886 Allergy status to analgesic agent status: Secondary | ICD-10-CM

## 2022-09-22 DIAGNOSIS — F419 Anxiety disorder, unspecified: Secondary | ICD-10-CM

## 2022-09-22 HISTORY — PX: PROCTOSCOPY: SHX2266

## 2022-09-22 LAB — ABO/RH: ABO/RH(D): O POS

## 2022-09-22 LAB — TYPE AND SCREEN
ABO/RH(D): O POS
Antibody Screen: NEGATIVE

## 2022-09-22 LAB — HEMOGLOBIN A1C
Hgb A1c MFr Bld: 5.6 % (ref 4.8–5.6)
Mean Plasma Glucose: 114 mg/dL

## 2022-09-22 SURGERY — COLECTOMY, SIGMOID, ROBOT-ASSISTED
Anesthesia: General

## 2022-09-22 MED ORDER — CELECOXIB 200 MG PO CAPS
200.0000 mg | ORAL_CAPSULE | ORAL | Status: AC
  Administered 2022-09-22: 200 mg via ORAL
  Filled 2022-09-22: qty 1

## 2022-09-22 MED ORDER — NEOMYCIN SULFATE 500 MG PO TABS: 1000.0000 mg | ORAL_TABLET | ORAL | Status: DC

## 2022-09-22 MED ORDER — LACTATED RINGERS IV SOLN: INTRAVENOUS | Status: DC

## 2022-09-22 MED ORDER — METRONIDAZOLE 500 MG PO TABS: 1000.0000 mg | ORAL_TABLET | ORAL | Status: DC

## 2022-09-22 MED ORDER — ENOXAPARIN SODIUM 40 MG/0.4ML IJ SOSY
40.0000 mg | PREFILLED_SYRINGE | Freq: Once | INTRAMUSCULAR | Status: AC
  Administered 2022-09-22: 40 mg via SUBCUTANEOUS
  Filled 2022-09-22: qty 0.4

## 2022-09-22 MED ORDER — ENSURE PRE-SURGERY PO LIQD
296.0000 mL | Freq: Once | ORAL | Status: DC
  Filled 2022-09-22: qty 296

## 2022-09-22 MED ORDER — ROCURONIUM BROMIDE 10 MG/ML (PF) SYRINGE
PREFILLED_SYRINGE | INTRAVENOUS | Status: AC
  Filled 2022-09-22: qty 10

## 2022-09-22 MED ORDER — PROCHLORPERAZINE MALEATE 10 MG PO TABS: 10.0000 mg | ORAL_TABLET | Freq: Four times a day (QID) | ORAL | Status: DC | PRN

## 2022-09-22 MED ORDER — PROPOFOL 10 MG/ML IV BOLUS
INTRAVENOUS | Status: DC | PRN
  Administered 2022-09-22: 200 mg via INTRAVENOUS

## 2022-09-22 MED ORDER — CALCIUM POLYCARBOPHIL 625 MG PO TABS
625.0000 mg | ORAL_TABLET | Freq: Two times a day (BID) | ORAL | Status: DC
  Administered 2022-09-22 – 2022-09-25 (×6): 625 mg via ORAL
  Filled 2022-09-22 (×6): qty 1

## 2022-09-22 MED ORDER — FENTANYL CITRATE PF 50 MCG/ML IJ SOSY
PREFILLED_SYRINGE | INTRAMUSCULAR | Status: AC
  Administered 2022-09-22: 25 ug via INTRAVENOUS
  Filled 2022-09-22: qty 2

## 2022-09-22 MED ORDER — ORAL CARE MOUTH RINSE: 15.0000 mL | Freq: Once | OROMUCOSAL | Status: AC

## 2022-09-22 MED ORDER — SIMETHICONE 80 MG PO CHEW: 40.0000 mg | CHEWABLE_TABLET | Freq: Four times a day (QID) | ORAL | Status: DC | PRN

## 2022-09-22 MED ORDER — GABAPENTIN 300 MG PO CAPS
300.0000 mg | ORAL_CAPSULE | ORAL | Status: AC
  Administered 2022-09-22: 300 mg via ORAL
  Filled 2022-09-22: qty 1

## 2022-09-22 MED ORDER — TRAMADOL HCL 50 MG PO TABS
50.0000 mg | ORAL_TABLET | Freq: Four times a day (QID) | ORAL | Status: DC | PRN
  Administered 2022-09-22: 100 mg via ORAL
  Administered 2022-09-23: 50 mg via ORAL
  Administered 2022-09-24: 100 mg via ORAL
  Administered 2022-09-24: 50 mg via ORAL
  Administered 2022-09-25: 100 mg via ORAL
  Filled 2022-09-22: qty 1
  Filled 2022-09-22 (×3): qty 2
  Filled 2022-09-22 (×2): qty 1

## 2022-09-22 MED ORDER — SODIUM CHLORIDE 0.9 % IV SOLN
2.0000 g | INTRAVENOUS | Status: AC
  Administered 2022-09-22: 2 g via INTRAVENOUS
  Filled 2022-09-22: qty 2

## 2022-09-22 MED ORDER — MIDAZOLAM HCL 2 MG/2ML IJ SOLN
INTRAMUSCULAR | Status: AC
  Filled 2022-09-22: qty 2

## 2022-09-22 MED ORDER — BUPIVACAINE-EPINEPHRINE (PF) 0.5% -1:200000 IJ SOLN
INTRAMUSCULAR | Status: DC | PRN
  Administered 2022-09-22: 30 mL

## 2022-09-22 MED ORDER — 0.9 % SODIUM CHLORIDE (POUR BTL) OPTIME
TOPICAL | Status: DC | PRN
  Administered 2022-09-22: 2000 mL

## 2022-09-22 MED ORDER — MIDAZOLAM HCL 5 MG/5ML IJ SOLN
INTRAMUSCULAR | Status: DC | PRN
  Administered 2022-09-22: 2 mg via INTRAVENOUS

## 2022-09-22 MED ORDER — PHENYLEPHRINE HCL-NACL 20-0.9 MG/250ML-% IV SOLN
INTRAVENOUS | Status: DC | PRN
  Administered 2022-09-22: 15 ug/min via INTRAVENOUS

## 2022-09-22 MED ORDER — FENTANYL CITRATE (PF) 100 MCG/2ML IJ SOLN
INTRAMUSCULAR | Status: AC
  Filled 2022-09-22: qty 2

## 2022-09-22 MED ORDER — METHOCARBAMOL 500 MG PO TABS
1000.0000 mg | ORAL_TABLET | Freq: Four times a day (QID) | ORAL | Status: DC | PRN
  Administered 2022-09-23 – 2022-09-24 (×4): 1000 mg via ORAL
  Filled 2022-09-22 (×4): qty 2

## 2022-09-22 MED ORDER — SPY AGENT GREEN - (INDOCYANINE FOR INJECTION)
INTRAMUSCULAR | Status: DC | PRN
  Administered 2022-09-22: 4 mL via INTRAVENOUS

## 2022-09-22 MED ORDER — LACTATED RINGERS IR SOLN
Status: DC | PRN
  Administered 2022-09-22: 1000 mL

## 2022-09-22 MED ORDER — MELATONIN 3 MG PO TABS: 3.0000 mg | ORAL_TABLET | Freq: Every evening | ORAL | Status: DC | PRN

## 2022-09-22 MED ORDER — SUGAMMADEX SODIUM 200 MG/2ML IV SOLN
INTRAVENOUS | Status: DC | PRN
  Administered 2022-09-22: 200 mg via INTRAVENOUS

## 2022-09-22 MED ORDER — ENSURE PRE-SURGERY PO LIQD
592.0000 mL | Freq: Once | ORAL | Status: DC
  Filled 2022-09-22: qty 592

## 2022-09-22 MED ORDER — FENTANYL CITRATE (PF) 100 MCG/2ML IJ SOLN
INTRAMUSCULAR | Status: DC | PRN
  Administered 2022-09-22: 25 ug via INTRAVENOUS
  Administered 2022-09-22: 100 ug via INTRAVENOUS
  Administered 2022-09-22: 25 ug via INTRAVENOUS

## 2022-09-22 MED ORDER — LIP MEDEX EX OINT
TOPICAL_OINTMENT | Freq: Two times a day (BID) | CUTANEOUS | Status: DC
  Administered 2022-09-22 – 2022-09-24 (×3): 75 via TOPICAL
  Filled 2022-09-22 (×3): qty 7

## 2022-09-22 MED ORDER — METOPROLOL TARTRATE 5 MG/5ML IV SOLN: 5.0000 mg | Freq: Four times a day (QID) | INTRAVENOUS | Status: DC | PRN

## 2022-09-22 MED ORDER — LIDOCAINE HCL (PF) 2 % IJ SOLN
INTRAMUSCULAR | Status: AC
  Filled 2022-09-22: qty 5

## 2022-09-22 MED ORDER — ENOXAPARIN SODIUM 40 MG/0.4ML IJ SOSY
40.0000 mg | PREFILLED_SYRINGE | INTRAMUSCULAR | Status: DC
  Administered 2022-09-23 – 2022-09-25 (×3): 40 mg via SUBCUTANEOUS
  Filled 2022-09-22 (×3): qty 0.4

## 2022-09-22 MED ORDER — LACTATED RINGERS IV SOLN: INTRAVENOUS | Status: DC | PRN

## 2022-09-22 MED ORDER — STERILE WATER FOR INJECTION IJ SOLN
INTRAMUSCULAR | Status: AC
  Filled 2022-09-22: qty 10

## 2022-09-22 MED ORDER — LIDOCAINE 2% (20 MG/ML) 5 ML SYRINGE
INTRAMUSCULAR | Status: DC | PRN
  Administered 2022-09-22: 100 mg via INTRAVENOUS
  Administered 2022-09-22: 1.5 mg/kg/h via INTRAVENOUS

## 2022-09-22 MED ORDER — SUCCINYLCHOLINE CHLORIDE 200 MG/10ML IV SOSY
PREFILLED_SYRINGE | INTRAVENOUS | Status: DC | PRN
  Administered 2022-09-22: 140 mg via INTRAVENOUS

## 2022-09-22 MED ORDER — STERILE WATER FOR IRRIGATION IR SOLN
Status: DC | PRN
  Administered 2022-09-22: 1000 mL

## 2022-09-22 MED ORDER — SODIUM CHLORIDE 0.9 % IV SOLN: 250.0000 mL | INTRAVENOUS | Status: DC | PRN

## 2022-09-22 MED ORDER — OXYCODONE HCL 5 MG PO TABS: 5.0000 mg | ORAL_TABLET | Freq: Once | ORAL | Status: AC | PRN

## 2022-09-22 MED ORDER — ACETAMINOPHEN 500 MG PO TABS
1000.0000 mg | ORAL_TABLET | ORAL | Status: AC
  Administered 2022-09-22: 1000 mg via ORAL
  Filled 2022-09-22: qty 2

## 2022-09-22 MED ORDER — PROPOFOL 10 MG/ML IV BOLUS
INTRAVENOUS | Status: AC
  Filled 2022-09-22: qty 20

## 2022-09-22 MED ORDER — DIPHENHYDRAMINE HCL 50 MG/ML IJ SOLN: 12.5000 mg | Freq: Four times a day (QID) | INTRAMUSCULAR | Status: DC | PRN

## 2022-09-22 MED ORDER — OXYCODONE HCL 5 MG/5ML PO SOLN: 5.0000 mg | Freq: Once | ORAL | Status: AC | PRN

## 2022-09-22 MED ORDER — ALVIMOPAN 12 MG PO CAPS
12.0000 mg | ORAL_CAPSULE | Freq: Two times a day (BID) | ORAL | Status: DC
  Administered 2022-09-23 (×2): 12 mg via ORAL
  Filled 2022-09-22 (×4): qty 1

## 2022-09-22 MED ORDER — OXYCODONE HCL 5 MG PO TABS
ORAL_TABLET | ORAL | Status: AC
  Administered 2022-09-22: 5 mg via ORAL
  Filled 2022-09-22: qty 1

## 2022-09-22 MED ORDER — BUPIVACAINE LIPOSOME 1.3 % IJ SUSP
INTRAMUSCULAR | Status: AC
  Filled 2022-09-22: qty 20

## 2022-09-22 MED ORDER — DIPHENHYDRAMINE HCL 12.5 MG/5ML PO ELIX: 12.5000 mg | ORAL_SOLUTION | Freq: Four times a day (QID) | ORAL | Status: DC | PRN

## 2022-09-22 MED ORDER — ACETAMINOPHEN 500 MG PO TABS
1000.0000 mg | ORAL_TABLET | Freq: Four times a day (QID) | ORAL | Status: DC
  Administered 2022-09-22 – 2022-09-25 (×12): 1000 mg via ORAL
  Filled 2022-09-22 (×12): qty 2

## 2022-09-22 MED ORDER — SUCCINYLCHOLINE CHLORIDE 200 MG/10ML IV SOSY
PREFILLED_SYRINGE | INTRAVENOUS | Status: AC
  Filled 2022-09-22: qty 10

## 2022-09-22 MED ORDER — SODIUM CHLORIDE (PF) 0.9 % IJ SOLN
INTRAMUSCULAR | Status: AC
  Filled 2022-09-22: qty 50

## 2022-09-22 MED ORDER — BUPIVACAINE-EPINEPHRINE (PF) 0.5% -1:200000 IJ SOLN
INTRAMUSCULAR | Status: AC
  Filled 2022-09-22: qty 30

## 2022-09-22 MED ORDER — POLYETHYLENE GLYCOL 3350 17 GM/SCOOP PO POWD
1.0000 | Freq: Once | ORAL | Status: DC
  Filled 2022-09-22: qty 255

## 2022-09-22 MED ORDER — METHOCARBAMOL 1000 MG/10ML IJ SOLN
1000.0000 mg | Freq: Four times a day (QID) | INTRAVENOUS | Status: DC | PRN
  Administered 2022-09-22: 1000 mg via INTRAVENOUS
  Filled 2022-09-22: qty 1000

## 2022-09-22 MED ORDER — LIDOCAINE HCL 2 % IJ SOLN
INTRAMUSCULAR | Status: AC
  Filled 2022-09-22: qty 20

## 2022-09-22 MED ORDER — METHOCARBAMOL 500 MG IVPB - SIMPLE MED
INTRAVENOUS | Status: AC
  Filled 2022-09-22: qty 110

## 2022-09-22 MED ORDER — PROCHLORPERAZINE EDISYLATE 10 MG/2ML IJ SOLN: 5.0000 mg | Freq: Four times a day (QID) | INTRAMUSCULAR | Status: DC | PRN

## 2022-09-22 MED ORDER — HYDRALAZINE HCL 20 MG/ML IJ SOLN: 10.0000 mg | INTRAMUSCULAR | Status: DC | PRN

## 2022-09-22 MED ORDER — MAGIC MOUTHWASH: 15.0000 mL | Freq: Four times a day (QID) | ORAL | Status: DC | PRN

## 2022-09-22 MED ORDER — ONDANSETRON HCL 4 MG/2ML IJ SOLN: 4.0000 mg | Freq: Four times a day (QID) | INTRAMUSCULAR | Status: DC | PRN

## 2022-09-22 MED ORDER — LACTATED RINGERS IV BOLUS: 1000.0000 mL | Freq: Three times a day (TID) | INTRAVENOUS | Status: AC | PRN

## 2022-09-22 MED ORDER — BUPIVACAINE LIPOSOME 1.3 % IJ SUSP: 20.0000 mL | Freq: Once | INTRAMUSCULAR | Status: DC

## 2022-09-22 MED ORDER — DEXAMETHASONE SODIUM PHOSPHATE 10 MG/ML IJ SOLN
INTRAMUSCULAR | Status: DC | PRN
  Administered 2022-09-22: 8 mg via INTRAVENOUS

## 2022-09-22 MED ORDER — ROCURONIUM BROMIDE 10 MG/ML (PF) SYRINGE
PREFILLED_SYRINGE | INTRAVENOUS | Status: DC | PRN
  Administered 2022-09-22: 20 mg via INTRAVENOUS
  Administered 2022-09-22: 10 mg via INTRAVENOUS
  Administered 2022-09-22: 60 mg via INTRAVENOUS

## 2022-09-22 MED ORDER — ALVIMOPAN 12 MG PO CAPS
12.0000 mg | ORAL_CAPSULE | ORAL | Status: AC
  Administered 2022-09-22: 12 mg via ORAL
  Filled 2022-09-22: qty 1

## 2022-09-22 MED ORDER — ONDANSETRON HCL 4 MG/2ML IJ SOLN
INTRAMUSCULAR | Status: AC
  Filled 2022-09-22: qty 2

## 2022-09-22 MED ORDER — ALUM & MAG HYDROXIDE-SIMETH 200-200-20 MG/5ML PO SUSP: 30.0000 mL | Freq: Four times a day (QID) | ORAL | Status: DC | PRN

## 2022-09-22 MED ORDER — CHLORHEXIDINE GLUCONATE 0.12 % MT SOLN
15.0000 mL | Freq: Once | OROMUCOSAL | Status: AC
  Administered 2022-09-22: 15 mL via OROMUCOSAL

## 2022-09-22 MED ORDER — SODIUM CHLORIDE (PF) 0.9 % IJ SOLN
INTRAMUSCULAR | Status: DC | PRN
  Administered 2022-09-22: 30 mL

## 2022-09-22 MED ORDER — DEXAMETHASONE SODIUM PHOSPHATE 10 MG/ML IJ SOLN
INTRAMUSCULAR | Status: AC
  Filled 2022-09-22: qty 1

## 2022-09-22 MED ORDER — SODIUM CHLORIDE 0.9 % IV SOLN
2.0000 g | Freq: Two times a day (BID) | INTRAVENOUS | Status: AC
  Administered 2022-09-22: 2 g via INTRAVENOUS
  Filled 2022-09-22: qty 2

## 2022-09-22 MED ORDER — BISACODYL 5 MG PO TBEC: 20.0000 mg | DELAYED_RELEASE_TABLET | Freq: Once | ORAL | Status: DC

## 2022-09-22 MED ORDER — SODIUM CHLORIDE (PF) 0.9 % IJ SOLN
INTRAMUSCULAR | Status: AC
  Filled 2022-09-22: qty 10

## 2022-09-22 MED ORDER — HYDROMORPHONE HCL 1 MG/ML IJ SOLN
0.5000 mg | INTRAMUSCULAR | Status: DC | PRN
  Administered 2022-09-23: 1 mg via INTRAVENOUS
  Filled 2022-09-22: qty 1

## 2022-09-22 MED ORDER — ONDANSETRON HCL 4 MG/2ML IJ SOLN
INTRAMUSCULAR | Status: DC | PRN
  Administered 2022-09-22: 4 mg via INTRAVENOUS

## 2022-09-22 MED ORDER — ENSURE SURGERY PO LIQD
237.0000 mL | Freq: Two times a day (BID) | ORAL | Status: DC
  Administered 2022-09-23 – 2022-09-25 (×5): 237 mL via ORAL

## 2022-09-22 MED ORDER — KETAMINE HCL 10 MG/ML IJ SOLN
INTRAMUSCULAR | Status: DC | PRN
  Administered 2022-09-22: 10 mg via INTRAVENOUS
  Administered 2022-09-22 (×2): 20 mg via INTRAVENOUS

## 2022-09-22 MED ORDER — ONDANSETRON HCL 4 MG PO TABS: 4.0000 mg | ORAL_TABLET | Freq: Four times a day (QID) | ORAL | Status: DC | PRN

## 2022-09-22 MED ORDER — SODIUM CHLORIDE 0.9% FLUSH
3.0000 mL | Freq: Two times a day (BID) | INTRAVENOUS | Status: DC
  Administered 2022-09-22 – 2022-09-24 (×5): 3 mL via INTRAVENOUS

## 2022-09-22 MED ORDER — FENTANYL CITRATE PF 50 MCG/ML IJ SOSY
25.0000 ug | PREFILLED_SYRINGE | INTRAMUSCULAR | Status: DC | PRN
  Administered 2022-09-22: 25 ug via INTRAVENOUS

## 2022-09-22 MED ORDER — SODIUM CHLORIDE 0.9% FLUSH: 3.0000 mL | INTRAVENOUS | Status: DC | PRN

## 2022-09-22 MED ORDER — BUPIVACAINE LIPOSOME 1.3 % IJ SUSP
INTRAMUSCULAR | Status: DC | PRN
  Administered 2022-09-22: 20 mL

## 2022-09-22 MED ORDER — BUPIVACAINE HCL (PF) 0.5 % IJ SOLN
INTRAMUSCULAR | Status: AC
  Filled 2022-09-22: qty 30

## 2022-09-22 SURGICAL SUPPLY — 130 items
ADAPTER GOLDBERG URETERAL (ADAPTER) IMPLANT
ADPR CATH 15X14FR FL DRN BG (ADAPTER) ×1
APL PRP STRL LF DISP 70% ISPRP (MISCELLANEOUS)
APPLIER CLIP 5 13 M/L LIGAMAX5 (MISCELLANEOUS)
APPLIER CLIP ROT 10 11.4 M/L (STAPLE)
APR CLP MED LRG 11.4X10 (STAPLE)
APR CLP MED LRG 5 ANG JAW (MISCELLANEOUS)
BAG COUNTER SPONGE SURGICOUNT (BAG) ×1 IMPLANT
BAG SPNG CNTER NS LX DISP (BAG)
BAG URO CATCHER STRL LF (MISCELLANEOUS) ×1 IMPLANT
BLADE EXTENDED COATED 6.5IN (ELECTRODE) IMPLANT
CANNULA REDUC XI 12-8 STAPL (CANNULA)
CANNULA REDUCER 12-8 DVNC XI (CANNULA) IMPLANT
CATH URETL OPEN 5X70 (CATHETERS) IMPLANT
CELLS DAT CNTRL 66122 CELL SVR (MISCELLANEOUS) IMPLANT
CHLORAPREP W/TINT 26 (MISCELLANEOUS) IMPLANT
CLIP APPLIE 5 13 M/L LIGAMAX5 (MISCELLANEOUS) IMPLANT
CLIP APPLIE ROT 10 11.4 M/L (STAPLE) IMPLANT
CLOTH BEACON ORANGE TIMEOUT ST (SAFETY) ×1 IMPLANT
COVER SURGICAL LIGHT HANDLE (MISCELLANEOUS) ×2 IMPLANT
COVER TIP SHEARS 8 DVNC (MISCELLANEOUS) ×1 IMPLANT
COVER TIP SHEARS 8MM DA VINCI (MISCELLANEOUS) ×1
DEVICE TROCAR PUNCTURE CLOSURE (ENDOMECHANICALS) IMPLANT
DRAIN CHANNEL 19F RND (DRAIN) IMPLANT
DRAPE ARM DVNC X/XI (DISPOSABLE) ×4 IMPLANT
DRAPE COLUMN DVNC XI (DISPOSABLE) ×1 IMPLANT
DRAPE DA VINCI XI ARM (DISPOSABLE) ×4
DRAPE DA VINCI XI COLUMN (DISPOSABLE) ×1
DRAPE SURG IRRIG POUCH 19X23 (DRAPES) ×1 IMPLANT
DRSG OPSITE POSTOP 4X10 (GAUZE/BANDAGES/DRESSINGS) IMPLANT
DRSG OPSITE POSTOP 4X6 (GAUZE/BANDAGES/DRESSINGS) IMPLANT
DRSG OPSITE POSTOP 4X8 (GAUZE/BANDAGES/DRESSINGS) IMPLANT
DRSG TEGADERM 2-3/8X2-3/4 SM (GAUZE/BANDAGES/DRESSINGS) ×5 IMPLANT
DRSG TEGADERM 4X4.75 (GAUZE/BANDAGES/DRESSINGS) IMPLANT
ELECT PENCIL ROCKER SW 15FT (MISCELLANEOUS) ×1 IMPLANT
ELECT REM PT RETURN 15FT ADLT (MISCELLANEOUS) ×1 IMPLANT
ENDOLOOP SUT PDS II  0 18 (SUTURE)
ENDOLOOP SUT PDS II 0 18 (SUTURE) IMPLANT
EVACUATOR SILICONE 100CC (DRAIN) IMPLANT
GAUZE SPONGE 2X2 8PLY STRL LF (GAUZE/BANDAGES/DRESSINGS) ×1 IMPLANT
GLOVE ECLIPSE 8.0 STRL XLNG CF (GLOVE) ×3 IMPLANT
GLOVE INDICATOR 8.0 STRL GRN (GLOVE) ×3 IMPLANT
GLOVE SURG LX STRL 7.5 STRW (GLOVE) ×1 IMPLANT
GOWN SRG XL LVL 4 BRTHBL STRL (GOWNS) ×1 IMPLANT
GOWN STRL NON-REIN XL LVL4 (GOWNS) ×1
GOWN STRL REUS W/ TWL LRG LVL3 (GOWN DISPOSABLE) ×2 IMPLANT
GOWN STRL REUS W/ TWL XL LVL3 (GOWN DISPOSABLE) ×4 IMPLANT
GOWN STRL REUS W/TWL LRG LVL3 (GOWN DISPOSABLE) ×2
GOWN STRL REUS W/TWL XL LVL3 (GOWN DISPOSABLE) ×4
GRASPER SUT TROCAR 14GX15 (MISCELLANEOUS) IMPLANT
GUIDEWIRE ANG ZIPWIRE 038X150 (WIRE) IMPLANT
GUIDEWIRE STR DUAL SENSOR (WIRE) IMPLANT
HOLDER FOLEY CATH W/STRAP (MISCELLANEOUS) ×1 IMPLANT
IRRIG SUCT STRYKERFLOW 2 WTIP (MISCELLANEOUS) ×1
IRRIGATION SUCT STRKRFLW 2 WTP (MISCELLANEOUS) ×1 IMPLANT
KIT PROCEDURE DA VINCI SI (MISCELLANEOUS) ×1
KIT PROCEDURE DVNC SI (MISCELLANEOUS) ×1 IMPLANT
KIT SIGMOIDOSCOPE (SET/KITS/TRAYS/PACK) IMPLANT
KIT TURNOVER KIT A (KITS) IMPLANT
MANIFOLD NEPTUNE II (INSTRUMENTS) ×1 IMPLANT
NDL INSUFFLATION 14GA 120MM (NEEDLE) ×1 IMPLANT
NEEDLE INSUFFLATION 14GA 120MM (NEEDLE) ×1 IMPLANT
PACK CARDIOVASCULAR III (CUSTOM PROCEDURE TRAY) ×1 IMPLANT
PACK COLON (CUSTOM PROCEDURE TRAY) ×1 IMPLANT
PACK CYSTO (CUSTOM PROCEDURE TRAY) ×1 IMPLANT
PAD POSITIONING PINK XL (MISCELLANEOUS) ×1 IMPLANT
PROTECTOR NERVE ULNAR (MISCELLANEOUS) ×2 IMPLANT
RELOAD STAPLE 45 3.5 BLU DVNC (STAPLE) IMPLANT
RELOAD STAPLE 45 4.3 GRN DVNC (STAPLE) IMPLANT
RELOAD STAPLE 60 3.5 BLU DVNC (STAPLE) IMPLANT
RELOAD STAPLE 60 4.3 GRN DVNC (STAPLE) IMPLANT
RELOAD STAPLER 3.5X45 BLU DVNC (STAPLE) IMPLANT
RELOAD STAPLER 3.5X60 BLU DVNC (STAPLE) IMPLANT
RELOAD STAPLER 4.3X45 GRN DVNC (STAPLE) IMPLANT
RELOAD STAPLER 4.3X60 GRN DVNC (STAPLE) ×1 IMPLANT
RETRACTOR WND ALEXIS 18 MED (MISCELLANEOUS) IMPLANT
RTRCTR WOUND ALEXIS 18CM MED (MISCELLANEOUS)
SCISSORS LAP 5X35 DISP (ENDOMECHANICALS) ×1 IMPLANT
SEAL CANN UNIV 5-8 DVNC XI (MISCELLANEOUS) ×3 IMPLANT
SEAL XI 5MM-8MM UNIVERSAL (MISCELLANEOUS) ×3
SEALER VESSEL DA VINCI XI (MISCELLANEOUS) ×1
SEALER VESSEL EXT DVNC XI (MISCELLANEOUS) ×1 IMPLANT
SOLUTION ELECTROLUBE (MISCELLANEOUS) ×1 IMPLANT
SPIKE FLUID TRANSFER (MISCELLANEOUS) ×1 IMPLANT
STAPLER 45 DA VINCI SURE FORM (STAPLE)
STAPLER 45 SUREFORM DVNC (STAPLE) IMPLANT
STAPLER 60 DA VINCI SURE FORM (STAPLE)
STAPLER 60 SUREFORM DVNC (STAPLE) IMPLANT
STAPLER CANNULA SEAL DVNC XI (STAPLE) ×1 IMPLANT
STAPLER CANNULA SEAL XI (STAPLE) ×1
STAPLER ECHELON POWER CIR 29 (STAPLE) IMPLANT
STAPLER ECHELON POWER CIR 31 (STAPLE) IMPLANT
STAPLER RELOAD 3.5X45 BLU DVNC (STAPLE)
STAPLER RELOAD 3.5X45 BLUE (STAPLE)
STAPLER RELOAD 3.5X60 BLU DVNC (STAPLE)
STAPLER RELOAD 3.5X60 BLUE (STAPLE)
STAPLER RELOAD 4.3X45 GREEN (STAPLE)
STAPLER RELOAD 4.3X45 GRN DVNC (STAPLE)
STAPLER RELOAD 4.3X60 GREEN (STAPLE) ×1
STAPLER RELOAD 4.3X60 GRN DVNC (STAPLE) ×1
STOPCOCK 4 WAY LG BORE MALE ST (IV SETS) ×2 IMPLANT
SURGILUBE 2OZ TUBE FLIPTOP (MISCELLANEOUS) IMPLANT
SUT MNCRL AB 4-0 PS2 18 (SUTURE) ×1 IMPLANT
SUT PDS AB 1 CT1 27 (SUTURE) ×2 IMPLANT
SUT PROLENE 0 CT 2 (SUTURE) IMPLANT
SUT PROLENE 2 0 KS (SUTURE) IMPLANT
SUT PROLENE 2 0 SH DA (SUTURE) IMPLANT
SUT SILK 2 0 (SUTURE)
SUT SILK 2 0 SH CR/8 (SUTURE) IMPLANT
SUT SILK 2-0 18XBRD TIE 12 (SUTURE) IMPLANT
SUT SILK 3 0 (SUTURE)
SUT SILK 3 0 SH CR/8 (SUTURE) ×1 IMPLANT
SUT SILK 3-0 18XBRD TIE 12 (SUTURE) IMPLANT
SUT V-LOC BARB 180 2/0GR6 GS22 (SUTURE)
SUT VIC AB 3-0 SH 18 (SUTURE) IMPLANT
SUT VIC AB 3-0 SH 27 (SUTURE)
SUT VIC AB 3-0 SH 27XBRD (SUTURE) IMPLANT
SUT VICRYL 0 UR6 27IN ABS (SUTURE) ×1 IMPLANT
SUTURE V-LC BRB 180 2/0GR6GS22 (SUTURE) IMPLANT
SYR 20ML ECCENTRIC (SYRINGE) ×1 IMPLANT
SYS LAPSCP GELPORT 120MM (MISCELLANEOUS)
SYS WOUND ALEXIS 18CM MED (MISCELLANEOUS) ×1
SYSTEM LAPSCP GELPORT 120MM (MISCELLANEOUS) IMPLANT
SYSTEM WOUND ALEXIS 18CM MED (MISCELLANEOUS) ×1 IMPLANT
TOWEL OR NON WOVEN STRL DISP B (DISPOSABLE) ×1 IMPLANT
TRAY FOLEY MTR SLVR 16FR STAT (SET/KITS/TRAYS/PACK) ×1 IMPLANT
TROCAR ADV FIXATION 5X100MM (TROCAR) ×1 IMPLANT
TUBING CONNECTING 10 (TUBING) ×3 IMPLANT
TUBING INSUFFLATION 10FT LAP (TUBING) ×1 IMPLANT
TUBING UROLOGY SET (TUBING) IMPLANT

## 2022-09-22 NOTE — Anesthesia Preprocedure Evaluation (Signed)
Anesthesia Evaluation  Patient identified by MRN, date of birth, ID band Patient awake    Reviewed: Allergy & Precautions, NPO status , Patient's Chart, lab work & pertinent test results  History of Anesthesia Complications Negative for: history of anesthetic complications  Airway Mallampati: IV  TM Distance: <3 FB Neck ROM: Full  Mouth opening: Limited Mouth Opening  Dental  (+) Dental Advisory Given, Teeth Intact   Pulmonary neg shortness of breath, sleep apnea and Continuous Positive Airway Pressure Ventilation , neg COPD, neg recent URI, former smoker   breath sounds clear to auscultation       Cardiovascular negative cardio ROS  Rhythm:Regular     Neuro/Psych  PSYCHIATRIC DISORDERS Anxiety     negative neurological ROS     GI/Hepatic Neg liver ROS,,,DIVERTICULITIS   Endo/Other  negative endocrine ROS    Renal/GU negative Renal ROS     Musculoskeletal negative musculoskeletal ROS (+)    Abdominal   Peds  Hematology negative hematology ROS (+)   Anesthesia Other Findings   Reproductive/Obstetrics                             Anesthesia Physical Anesthesia Plan  ASA: 2  Anesthesia Plan: General   Post-op Pain Management: Tylenol PO (pre-op)*, Celebrex PO (pre-op)*, Gabapentin PO (pre-op)* and Lidocaine infusion*   Induction: Rapid sequence and Intravenous  PONV Risk Score and Plan: 2 and Ondansetron and Dexamethasone  Airway Management Planned: Oral ETT and Video Laryngoscope Planned  Additional Equipment: None  Intra-op Plan:   Post-operative Plan: Extubation in OR  Informed Consent: I have reviewed the patients History and Physical, chart, labs and discussed the procedure including the risks, benefits and alternatives for the proposed anesthesia with the patient or authorized representative who has indicated his/her understanding and acceptance.     Dental advisory  given  Plan Discussed with: CRNA  Anesthesia Plan Comments:        Anesthesia Quick Evaluation

## 2022-09-22 NOTE — Interval H&P Note (Signed)
History and Physical Interval Note:  09/22/2022 10:15 AM  Roger Jennings  has presented today for surgery, with the diagnosis of DIVERTICULITIS.  The various methods of treatment have been discussed with the patient and family. After consideration of risks, benefits and other options for treatment, the patient has consented to  Procedure(s) with comments: ROBOTIC RECTION OF COLON, RECTOSIGMOID (N/A) POSSIBLE OSTOMY (N/A) RIGID PROCTOSCOPY (N/A) CYSTOSCOPY with FIREFLY INJECTION (N/A) - HERRICK WILL GO FIRST AT 11:45 as a surgical intervention.  The patient's history has been reviewed, patient examined, no change in status, stable for surgery.  I have reviewed the patient's chart and labs.  Questions were answered to the patient's satisfaction.    I have re-reviewed the the patient's records, history, medications, and allergies.  I have re-examined the patient.  I again discussed intraoperative plans and goals of post-operative recovery.  The patient agrees to proceed.  Roger Jennings  Roger Jennings, Roger Jennings [id number]  Patient Care Team: Patient, No Pcp Per as PCP - General (General Practice) Mansouraty, Telford Nab., MD as Consulting Physician (Gastroenterology) Deneise Lever, MD as Consulting Physician (Pulmonary Disease) Charlotte Crumb, MD as Consulting Physician (Orthopedic Surgery) Michael Boston, MD as Consulting Physician (General Surgery)  Patient Active Problem List   Diagnosis Date Noted   AKI (acute kidney injury) ([place]) 08/06/2022   Diverticulitis 08/05/2022   Abnormal CT of the abdomen 09/Jennings/2020   Hx of diverticulitis of colon 09/Jennings/2020   History of colitis 09/Jennings/2020   Under or uninsured 09/Jennings/2020   Exposure to COVID-19 virus 05/02/2019   Decreased ROM of finger 05/11/2017   Pain in finger of right hand 05/11/2017   Closed nondisplaced fracture of neck of fifth metacarpal bone of right hand 05/08/2017   Diverticulitis of colon 12/02/2014   Rectal bleeding 12/02/2014    OBSTRUCTIVE SLEEP APNEA 02/18/2010    Past Medical History:  Diagnosis Date   Allergy    Anxiety    Diverticulitis    Diverticulosis    Gout    History of kidney stones    Internal hemorrhoids    Kidney stones    Nephrolithiasis    OSA on CPAP    Pre-diabetes    Sleep apnea     Past Surgical History:  Procedure Laterality Date   NO PAST SURGERIES      Social History   Socioeconomic History   Marital status: Single    Spouse name: Not on file   Number of children: Not on file   Years of education: Not on file   Highest education level: Not on file  Occupational History   Occupation: employed    Comment: [employer]  Tobacco Use   Smoking status: Former    Types: Cigarettes    Quit date: 10/10/2001    Years since quitting: 20.9   Smokeless tobacco: Never  Vaping Use   Vaping Use: Never used  Substance and Sexual Activity   Alcohol use: No    Alcohol/week: 0.0 standard drinks of alcohol    Comment: rare   Drug use: No   Sexual activity: Yes  Other Topics Concern   Not on file  Social History Narrative   Not on file   Social Determinants of Health   Financial Resource Strain: Not on file  Food Insecurity: Not on file  Transportation Needs: Not on file  Physical Activity: Not on file  Stress: Not on file  Social Connections: Not on file  Intimate Partner Violence: Not on file  Family History  Problem Relation Age of Onset   Diabetes Mellitus II Mother    Fibromyalgia Mother    Hypertension Father    Colon polyps Father    Coronary artery disease Paternal Grandmother    Colon cancer Neg Hx    Esophageal cancer Neg Hx    Gallbladder disease Neg Hx    Rectal cancer Neg Hx    Stomach cancer Neg Hx    Inflammatory bowel disease Neg Hx    Liver disease Neg Hx    Pancreatic cancer Neg Hx     Medications Prior to Admission  Medication Sig Dispense Refill Last Dose   acetaminophen (TYLENOL) 500 MG tablet Take 1,000 mg by mouth every 6  (six) hours as needed for moderate pain.   Past Month   allopurinol (ZYLOPRIM) 100 MG tablet TAKE 2 TABLETS BY MOUTH EVERY DAY 180 tablet 0 Past Week   dicyclomine (BENTYL) 20 MG tablet Take 1 tablet (20 mg total) by mouth 3 (three) times daily as needed for spasms. (Patient not taking: Reported on 09/12/2022) 20 tablet 0 Not Taking    Current Facility-Administered Medications  Medication Dose Route Frequency Provider Last Rate Last Admin   bisacodyl (DULCOLAX) EC tablet 20 mg  20 mg Oral Once Michael Boston, MD       bupivacaine liposome (EXPAREL) 1.3 % injection 266 mg  20 mL Infiltration Once Michael Boston, MD       cefoTEtan (CEFOTAN) 2 g in sodium chloride 0.9 % 100 mL IVPB  2 g Intravenous On Call to OR Michael Boston, MD       Derrill Memo ON 09/23/2022] feeding supplement (ENSURE PRE-SURGERY) liquid 296 mL  296 mL Oral Once Michael Boston, MD       feeding supplement (ENSURE PRE-SURGERY) liquid 592 mL  592 mL Oral Once Michael Boston, MD       lactated ringers infusion   Intravenous Continuous Lidia Collum, MD 10 mL/hr at 09/22/22 1004 New Bag at 09/22/22 1004     Allergies  Allergen Reactions   Indomethacin Er Other (See Comments)    Gastrointestinal upset    BP 123/85   Pulse 80   Temp 98.5 F (36.9 C) (Oral)   Resp 16   SpO2 97%   Labs: No results found for this or any previous visit (from the past 48 hour(s)).  Imaging / Studies: No results found.   Adin Hector, M.D., F.A.C.S. Gastrointestinal and Minimally Invasive Surgery [facility] [address] [place], [place] [address] [phone] Main / Paging  09/22/2022 10:15 AM    Adin Hector

## 2022-09-22 NOTE — Discharge Instructions (Signed)
SURGERY: POST OP INSTRUCTIONS (Surgery for small bowel obstruction, colon resection, etc)   ######################################################################  EAT Gradually transition to a high fiber diet with a fiber supplement over the next few days after discharge  WALK Walk an hour a day.  Control your pain to do that.    CONTROL PAIN Control pain so that you can walk, sleep, tolerate sneezing/coughing, go up/down stairs.  HAVE A BOWEL MOVEMENT DAILY Keep your bowels regular to avoid problems.  OK to try a laxative to override constipation.  OK to use an antidairrheal to slow down diarrhea.  Call if not better after 2 tries  CALL IF YOU HAVE PROBLEMS/CONCERNS Call if you are still struggling despite following these instructions. Call if you have concerns not answered by these instructions  ######################################################################   DIET Follow a light diet the first few days at home.  Start with a bland diet such as soups, liquids, starchy foods, low fat foods, etc.  If you feel full, bloated, or constipated, stay on a ful liquid or pureed/blenderized diet for a few days until you feel better and no longer constipated. Be sure to drink plenty of fluids every day to avoid getting dehydrated (feeling dizzy, not urinating, etc.). Gradually add a fiber supplement to your diet over the next week.  Gradually get back to a regular solid diet.  Avoid fast food or heavy meals the first week as you are more likely to get nauseated. It is expected for your digestive tract to need a few months to get back to normal.  It is common for your bowel movements and stools to be irregular.  You will have occasional bloating and cramping that should eventually fade away.  Until you are eating solid food normally, off all pain medications, and back to regular activities; your bowels will not be normal. Focus on eating a low-fat, high fiber diet the rest of your life  (See Getting to [place], below).  CARE of your INCISION or WOUND  It is good for closed incisions and even open wounds to be washed every day.  Shower every day.  Short baths are fine.  Wash the incisions and wounds clean with soap & water.    You may leave closed incisions open to air if it is dry.   You may cover the incision with clean gauze & replace it after your daily shower for comfort.  TEGADERM:  You have clear gauze band-aid dressings over your closed incision(s).  Remove the dressings 3 days after surgery.= 09/25/2022 SUNDAY    If you have an open wound with a wound vac, see wound vac care instructions.    ACTIVITIES as tolerated Start light daily activities --- self-care, walking, climbing stairs-- beginning the day after surgery.  Gradually increase activities as tolerated.  Control your pain to be active.  Stop when you are tired.  Ideally, walk several times a day, eventually an hour a day.   Most people are back to most day-to-day activities in a few weeks.  It takes 4-8 weeks to get back to unrestricted, intense activity. If you can walk 30 minutes without difficulty, it is safe to try more intense activity such as jogging, treadmill, bicycling, low-impact aerobics, swimming, etc. Save the most intensive and strenuous activity for last (Usually 4-8 weeks after surgery) such as sit-ups, heavy lifting, contact sports, etc.  Refrain from any intense heavy lifting or straining until you are off narcotics for pain control.  You will have off  days, but things should improve week-by-week. DO NOT PUSH THROUGH PAIN.  Let pain be your guide: If it hurts to do something, don't do it.  Pain is your body warning you to avoid that activity for another week until the pain goes down. You may drive when you are no longer taking narcotic prescription pain medication, you can comfortably wear a seatbelt, and you can safely make sudden turns/stops to protect yourself without hesitating  due to pain. You may have sexual intercourse when it is comfortable. If it hurts to do something, stop.  MEDICATIONS Take your usually prescribed home medications unless otherwise directed.   Blood thinners:  Usually you can restart any strong blood thinners after the second postoperative day.  It is OK to take aspirin right away.     If you are on strong blood thinners (warfarin/Coumadin, Plavix, Xerelto, Eliquis, Pradaxa, etc), discuss with your surgeon, medicine PCP, and/or cardiologist for instructions on when to restart the blood thinner & if blood monitoring is needed (PT/INR blood check, etc).     PAIN CONTROL Pain after surgery or related to activity is often due to strain/injury to muscle, tendon, nerves and/or incisions.  This pain is usually short-term and will improve in a few months.  To help speed the process of healing and to get back to regular activity more quickly, DO THE FOLLOWING THINGS TOGETHER: Increase activity gradually.  DO NOT PUSH THROUGH PAIN Use Ice and/or Heat Try Gentle Massage and/or Stretching Take over the counter pain medication Take Narcotic prescription pain medication for more severe pain  Good pain control = faster recovery.  It is better to take more medicine to be more active than to stay in bed all day to avoid medications.  Increase activity gradually Avoid heavy lifting at first, then increase to lifting as tolerated over the next 6 weeks. Do not "push through" the pain.  Listen to your body and avoid positions and maneuvers than reproduce the pain.  Wait a few days before trying something more intense Walking an hour a day is encouraged to help your body recover faster and more safely.  Start slowly and stop when getting sore.  If you can walk 30 minutes without stopping or pain, you can try more intense activity (running, jogging, aerobics, cycling, swimming, treadmill, sex, sports, weightlifting, etc.) Remember: If it hurts to do it, then don't  do it! Use Ice and/or Heat You will have swelling and bruising around the incisions.  This will take several weeks to resolve. Ice packs or heating pads (6-8 times a day, 30-60 minutes at a time) will help sooth soreness & bruising. Some people prefer to use ice alone, heat alone, or alternate between ice & heat.  Experiment and see what works best for you.  Consider trying ice for the first few days to help decrease swelling and bruising; then, switch to heat to help relax sore spots and speed recovery. Shower every day.  Short baths are fine.  It feels good!  Keep the incisions and wounds clean with soap & water.   Try Gentle Massage and/or Stretching Massage at the area of pain many times a day Stop if you feel pain - do not overdo it Take over the counter pain medication This helps the muscle and nerve tissues become less irritable and calm down faster Choose ONE of the following over-the-counter anti-inflammatory medications: Acetaminophen '500mg'$  tabs (Tylenol) 1-2 pills with every meal and just before bedtime (avoid if you have liver  problems or if you have acetaminophen in you narcotic prescription) Naproxen 217m tabs (ex. Aleve, Naprosyn) 1-2 pills twice a day (avoid if you have kidney, stomach, IBD, or bleeding problems) Ibuprofen 2093mtabs (ex. Advil, Motrin) 3-4 pills with every meal and just before bedtime (avoid if you have kidney, stomach, IBD, or bleeding problems) Take with food/snack several times a day as directed for at least 2 weeks to help keep pain / soreness down & more manageable. Take Narcotic prescription pain medication for more severe pain A prescription for strong pain control is often given to you upon discharge (for example: oxycodone/Percocet, hydrocodone/Norco/Vicodin, or tramadol/Ultram) Take your pain medication as prescribed. Be mindful that most narcotic prescriptions contain Tylenol (acetaminophen) as well - avoid taking too much Tylenol. If you are having  problems/concerns with the prescription medicine (does not control pain, nausea, vomiting, rash, itching, etc.), please call [phone] see if we need to switch you to a different pain medicine that will work better for you and/or control your side effects better. If you need a refill on your pain medication, you must call the office before 4 pm and on weekdays only.  By federal law, prescriptions for narcotics cannot be called into a pharmacy.  They must be filled out on paper & picked up from our office by the patient or authorized caretaker.  Prescriptions cannot be filled after 4 pm nor on weekends.    WHEN TO CALL [phone] uncontrolled or worsening pain  Fever over 101 F (38.5 C) Concerns with the incision: Worsening pain, redness, rash/hives, swelling, bleeding, or drainage Reactions / problems with new medications (itching, rash, hives, nausea, etc.) Nausea and/or vomiting Difficulty urinating Difficulty breathing Worsening fatigue, dizziness, lightheadedness, blurred vision Other concerns If you are not getting better after two weeks or are noticing you are getting worse, contact our office [phone] for further advice.  We may need to adjust your medications, re-evaluate you in the office, send you to the emergency room, or see what other things we can do to help. The clinic staff is available to answer your questions during regular business hours (8:30am-5pm).  Please don't hesitate to call and ask to speak to one of our nurses for clinical concerns.    A [facility] is always on call at the hospitals 24 hours/day If you have a medical emergency, go to the nearest emergency room or call 911.  FOLLOW UP in our office One the day of your discharge from the hospital (or the next business weekday), please call [facility] to set up or confirm an appointment to see your surgeon in the office for a follow-up appointment.   Usually it is 2-3 weeks after your surgery.   If you have skin staples at your incision(s), let the office know so we can set up a time in the office for the nurse to remove them (usually around 10 days after surgery). Make sure that you call for appointments the day of discharge (or the next business weekday) from the hospital to ensure a convenient appointment time. IF YOU HAVE DISABILITY OR FAMILY LEAVE FORMS, BRING THEM TO THE OFFICE FOR PROCESSING.  DO NOT GIVE THEM TO YOUR DOCTOR.  [facility] [address] [address]  [id number] ([phone] Main [phone] [place] ([phone] Fax [email or web address]    GETTING TO GOOD BOWEL HEALTH. It is expected  for your digestive tract to need a few months to get back to normal.  It is common for your bowel movements and stools to be irregular.  You will have occasional bloating and cramping that should eventually fade away.  Until you are eating solid food normally, off all pain medications, and back to regular activities; your bowels will not be normal.   Avoiding constipation The goal: ONE SOFT BOWEL MOVEMENT A DAY!    Drink plenty of fluids.  Choose water first. TAKE A FIBER SUPPLEMENT EVERY DAY THE REST OF YOUR LIFE During your first week back home, gradually add back a fiber supplement every day Experiment which form you can tolerate.   There are many forms such as powders, tablets, wafers, gummies, etc Psyllium bran (Metamucil), methylcellulose (Citrucel), Miralax or Glycolax, Benefiber, Flax Seed.  Adjust the dose week-by-week (1/2 dose/day to 6 doses a day) until you are moving your bowels 1-2 times a day.  Cut back the dose or try a different fiber product if it is giving you problems such as diarrhea or bloating. Sometimes a laxative is needed to help jump-start bowels if constipated until the fiber supplement can help regulate your bowels.  If you are tolerating eating  & you are farting, it is okay to try a gentle laxative such as double dose MiraLax, prune juice, or Milk of Magnesia.  Avoid using laxatives too often. Stool softeners can sometimes help counteract the constipating effects of narcotic pain medicines.  It can also cause diarrhea, so avoid using for too long. If you are still constipated despite taking fiber daily, eating solids, and a few doses of laxatives, call our office. Controlling diarrhea Try drinking liquids and eating bland foods for a few days to avoid stressing your intestines further. Avoid dairy products (especially milk & ice cream) for a short time.  The intestines often can lose the ability to digest lactose when stressed. Avoid foods that cause gassiness or bloating.  Typical foods include beans and other legumes, cabbage, broccoli, and dairy foods.  Avoid greasy, spicy, fast foods.  Every person has some sensitivity to other foods, so listen to your body and avoid those foods that trigger problems for you. Probiotics (such as active yogurt, Align, etc) may help repopulate the intestines and colon with normal bacteria and calm down a sensitive digestive tract Adding a fiber supplement gradually can help thicken stools by absorbing excess fluid and retrain the intestines to act more normally.  Slowly increase the dose over a few weeks.  Too much fiber too soon can backfire and cause cramping & bloating. It is okay to try and slow down diarrhea with a few doses of antidiarrheal medicines.   Bismuth subsalicylate (ex. Kayopectate, Pepto Bismol) for a few doses can help control diarrhea.  Avoid if pregnant.   Loperamide (Imodium) can slow down diarrhea.  Start with one tablet (6m) first.  Avoid if you are having fevers or severe pain.  ILEOSTOMY PATIENTS WILL HAVE CHRONIC DIARRHEA since their colon is not in use.    Drink plenty of liquids.  You will need to drink even more glasses of water/liquid a day to avoid getting dehydrated. Record  output from your ileostomy.  Expect to empty the bag every 3-4 hours at first.  Most people with a permanent ileostomy empty their bag 4-6 times at the least.   Use antidiarrheal medicine (especially Imodium) several times a day to avoid getting dehydrated.  Start with a dose at bedtime &  breakfast.  Adjust up or down as needed.  Increase antidiarrheal medications as directed to avoid emptying the bag more than 8 times a day (every 3 hours). Work with your wound ostomy nurse to learn care for your ostomy.  See ostomy care instructions. TROUBLESHOOTING IRREGULAR BOWELS 1) Start with a soft & bland diet. No spicy, greasy, or fried foods.  2) Avoid gluten/wheat or dairy products from diet to see if symptoms improve. 3) Miralax 17gm or flax seed mixed in [place]. water or juice-daily. May use 2-4 times a day as needed. 4) Gas-X, Phazyme, etc. as needed for gas & bloating.  5) Prilosec (omeprazole) over-the-counter as needed 6)  Consider probiotics (Align, Activa, etc) to help calm the bowels down  Call your doctor if you are getting worse or not getting better.  Sometimes further testing (cultures, endoscopy, X-ray studies, CT scans, bloodwork, etc.) may be needed to help diagnose and treat the cause of the diarrhea. [facility], [place], [place], [place], [place]  [address] [phone] - Main.    [phone]  - Toll Free.   [phone] - Fax [email or web address]

## 2022-09-22 NOTE — Transfer of Care (Signed)
Immediate Anesthesia Transfer of Care Note  Patient: Roger Jennings  Procedure(s) Performed: ROBOTIC ASSISTED RESECTION OF COLON, RECTOSIGMOID RIGID PROCTOSCOPY CYSTOSCOPY with FIREFLY INJECTION  Patient Location: PACU  Anesthesia Type:General  Level of Consciousness: awake, alert , oriented, and patient cooperative  Airway & Oxygen Therapy: Patient Spontanous Breathing and Patient connected to face mask oxygen  Post-op Assessment: Report given to RN, Post -op Vital signs reviewed and stable, and Patient moving all extremities  Post vital signs: Reviewed and stable  Last Vitals:  Vitals Value Taken Time  BP 166/86 09/22/22 1421  Temp    Pulse 89 09/22/22 1424  Resp 15 09/22/22 1424  SpO2 100 % 09/22/22 1424  Vitals shown include unvalidated device data.  Last Pain:  Vitals:   09/22/22 0953  TempSrc: Oral  PainSc: 0-No pain         Complications:  Encounter Notable Events  Notable Event Outcome Phase Comment  Difficult to intubate - expected  Intraprocedure Filed from anesthesia note documentation.

## 2022-09-22 NOTE — H&P (Signed)
I have been asked to see the patient by Dr. Johney Maine, for evaluation and management of sigmoid diverticulitis.  History of present illness: 67M w/ history of recurrent diverticulitis who is here sigmoid colectomy for definitive therapy.  As part of the surgery Dr. Johney Maine has asked me to perform cystoscopy and instill firefly contrast into the ureters.  The patient has no bothersome urinary tract symptoms and no urologic past medical history.   Review of systems: A 12 point comprehensive review of systems was obtained and is negative unless otherwise stated in the history of present illness.  Patient Active Problem List   Diagnosis Date Noted   AKI (acute kidney injury) ([place]) 08/06/2022   Diverticulitis 08/05/2022   Abnormal CT of the abdomen 06/14/2019   Hx of diverticulitis of colon 06/14/2019   History of colitis 06/14/2019   Under or uninsured 06/14/2019   Exposure to COVID-19 virus 05/02/2019   Decreased ROM of finger 05/11/2017   Pain in finger of right hand 05/11/2017   Closed nondisplaced fracture of neck of fifth metacarpal bone of right hand 05/08/2017   Diverticulitis of colon 12/02/2014   Rectal bleeding 12/02/2014   OBSTRUCTIVE SLEEP APNEA 02/18/2010    No current facility-administered medications on file prior to encounter.   Current Outpatient Medications on File Prior to Encounter  Medication Sig Dispense Refill   acetaminophen (TYLENOL) 500 MG tablet Take 1,000 mg by mouth every 6 (six) hours as needed for moderate pain.      Past Medical History:  Diagnosis Date   Allergy    Anxiety    Diverticulitis    Diverticulosis    Gout    History of kidney stones    Internal hemorrhoids    Kidney stones    Nephrolithiasis    OSA on CPAP    Pre-diabetes    Sleep apnea     Past Surgical History:  Procedure Laterality Date   NO PAST SURGERIES      Social History   Tobacco Use   Smoking status: Former    Types: Cigarettes    Quit date: 10/10/2001    Years since  quitting: 20.9   Smokeless tobacco: Never  Vaping Use   Vaping Use: Never used  Substance Use Topics   Alcohol use: No    Alcohol/week: 0.0 standard drinks of alcohol    Comment: rare   Drug use: No    Family History  Problem Relation Age of Onset   Diabetes Mellitus II Mother    Fibromyalgia Mother    Hypertension Father    Colon polyps Father    Coronary artery disease Paternal Grandmother    Colon cancer Neg Hx    Esophageal cancer Neg Hx    Gallbladder disease Neg Hx    Rectal cancer Neg Hx    Stomach cancer Neg Hx    Inflammatory bowel disease Neg Hx    Liver disease Neg Hx    Pancreatic cancer Neg Hx     PE: Vitals:   09/22/22 0953  BP: 123/85  Pulse: 80  Resp: 16  Temp: 98.5 F (36.9 C)  TempSrc: Oral  SpO2: 97%   Patient appears to be in no acute distress  patient is alert and oriented x3 Atraumatic normocephalic head No cervical or supraclavicular lymphadenopathy appreciated No increased work of breathing, no audible wheezes/rhonchi Regular sinus rhythm/rate Abdomen is soft, nontender, nondistended, no CVA or suprapubic tenderness Lower extremities are symmetric without appreciable edema Grossly neurologically intact No identifiable skin  lesions  No results for input(s): "WBC", "HGB", "HCT" in the last 72 hours. No results for input(s): "NA", "K", "CL", "CO2", "GLUCOSE", "BUN", "CREATININE", "CALCIUM" in the last 72 hours. No results for input(s): "LABPT", "INR" in the last 72 hours. No results for input(s): "LABURIN" in the last 72 hours. Results for orders placed or performed during the hospital encounter of 08/05/22  Blood culture (routine x 2)     Status: None   Collection Time: 08/05/22  7:37 PM   Specimen: BLOOD  Result Value Ref Range Status   Specimen Description BLOOD SITE NOT SPECIFIED  Final   Special Requests   Final    BOTTLES DRAWN AEROBIC AND ANAEROBIC Blood Culture results may not be optimal due to an excessive volume of blood  received in culture bottles   Culture   Final    NO GROWTH 5 DAYS Performed at [facility], [place] [address], [place], [place] [address]    Report Status 08/10/2022 FINAL  Final  Blood culture (routine x 2)     Status: None   Collection Time: 08/05/22  9:21 PM   Specimen: BLOOD  Result Value Ref Range Status   Specimen Description BLOOD SITE NOT SPECIFIED  Final   Special Requests   Final    BOTTLES DRAWN AEROBIC AND ANAEROBIC Blood Culture adequate volume   Culture   Final    NO GROWTH 5 DAYS Performed at [facility] [address] [place],  [address]    Report Status 08/10/2022 FINAL  Final    Imaging: I have independently reviewed the patient's CT scan which demonstrates stranding of the sigmoid/descending colon.  There does not appear to be any hydronephrosis or urologic abnormality  Imp:  Recurrent symptomatic diverticulitis  Recommendations: Plan to proceed with cystoscopy and firefly instillation prior to his planned robotic colectomy.  R/B discussed, consent obtained.   Ardis Hughs

## 2022-09-22 NOTE — H&P (Signed)
09/22/2022   Patient Care Team: Provider as PCP - General Kinsinger, Arta Bruce, MD as Consulting Provider (General Surgery) Malloree Raboin, Adrian Saran, MD as Consulting Provider (Colon and Rectal Surgery) Mansouraty, Telford Nab., MD (Gastroenterology) Baird Lyons Driver, MD (Pulmonary Disease)  PROVIDER: Hollace Kinnier, MD  [facility] MRN: [id number] DOB: 1976-07-18  SUBJECTIVE   Chief Complaint: New Consultation   History of Present Illness: Roger Jennings is a 46 y.o. male who is seen today  as an office consultation at the request of Dr. Pasty Arch  for evaluation of New Consultation .   Patient returns office after hospitalization. History of recurrent lower abdominal pain and diverticulitis. Usually tries to manage at home but had a more severe episode. Multiple episodes & recurrent.  Came to the [facility] department 10/27. Diverticulitis with probable intramural abscess is noted. Placed on an IV antibiotics. Seem to gradually open up and improved and was discharged 5 days later on oral antibiotics.  Patient is done with his antibiotics and feeling better. Has any abdominal pain. No fevers or chills. He has had some urgency with urination or discomfort when he strains but that is faded away. Tends to not have doctors. 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 Worried about costs. No bleeding. He has not smoked in 20 years. No diabetes.  Medical History:  History reviewed. No pertinent past medical history.  Patient Active Problem List  Diagnosis  Diverticulitis of large intestine with abscess without bleeding  OSA on CPAP  Under or uninsured  Obesity (BMI 30-39.9), unspecified   History reviewed. No pertinent surgical history.   No Known Allergies  Current Outpatient Medications on File Prior to Visit  Medication Sig Dispense Refill  allopurinoL (ZYLOPRIM) 100 MG tablet Take by mouth   No current facility-administered medications on file prior to visit.   Family  History  Problem Relation Age of Onset  Diabetes Mother  High blood pressure (Hypertension) Father  Hyperlipidemia (Elevated cholesterol) Father  Coronary Artery Disease (Blocked arteries around heart) Father  Diabetes Father    Social History   Tobacco Use  Smoking Status Former  Types: Cigarettes  Smokeless Tobacco Never    Social History   Socioeconomic History  Marital status: Single  Tobacco Use  Smoking status: Former  Types: Cigarettes  Smokeless tobacco: Never  Substance and Sexual Activity  Alcohol use: Not Currently  Drug use: Never   ############################################################  Review of Systems: A complete review of systems (ROS) was obtained from the patient. I have reviewed this information and discussed as appropriate with the patient. See HPI as well for other pertinent ROS.  Constitutional: No fevers, chills, sweats. Weight stable Eyes: No vision changes, No discharge HENT: No sore throats, nasal drainage Lymph: No neck swelling, No bruising easily Pulmonary: No cough, productive sputum CV: No orthopnea, PND . No exertional chest/neck/shoulder/arm pain. Patient can walk 30 minutes without difficulty.   GI: No personal nor family history of GI/colon cancer, inflammatory bowel disease, irritable bowel syndrome, allergy such as Celiac Sprue, dietary/dairy problems, colitis, ulcers nor gastritis. No recent sick contacts/gastroenteritis. No travel outside the country. No changes in diet.  Renal: No UTIs, No hematuria Genital: No drainage, bleeding, masses Musculoskeletal: No severe joint pain. Good ROM major joints Skin: No sores or lesions Heme/Lymph: No easy bleeding. No swollen lymph nodes Neuro: No active seizures. No facial droop Psych: No hallucinations. No agitation  OBJECTIVE   Vitals:  08/22/22 1626  BP: 100/75  Pulse: 69  Temp: 36.7 C (98  F)  SpO2: 97%  Weight: (!) 107.5 kg (237 lb)  Height: 177.8 cm ('5\' 10"'$ )    Body mass index is 34.01 kg/m.  PHYSICAL EXAM:  Constitutional: Not cachectic. Hygeine adequate. Vitals signs as above.  Eyes: No glasses. Vision adequate,Pupils reactive, normal extraocular movements. Sclera nonicteric Neuro: CN II-XII intact. No major focal sensory defects. No major motor deficits. Lymph: No head/neck/groin lymphadenopathy Psych: No severe agitation. No severe anxiety. Judgment & insight Adequate, Oriented x4, HENT: Normocephalic, Mucus membranes moist. No thrush. Hearing: adequate Neck: Supple, No tracheal deviation. No obvious thyromegaly Chest: No pain to chest wall compression. Good respiratory excursion. No audible wheezing CV: Pulses intact. regular. No major extremity edema Ext: No obvious deformity or contracture. Edema: Not present. No cyanosis Skin: No major subcutaneous nodules. Warm and dry Musculoskeletal: Severe joint rigidity not present. No obvious clubbing. No digital petechiae. Mobility: no assist device moving easily without restrictions  Abdomen: Obese Soft. Nondistended. Nontender. Hernia: Not present. Diastasis recti: Not present. No hepatomegaly. No splenomegaly.  Genital/Pelvic: Inguinal hernia: Not present. Inguinal lymph nodes: without lymphadenopathy nor hidradenitis.   Rectal: (Deferred)    ###################################################################  Labs, Imaging and Diagnostic Testing:  Located in 'Care Everywhere' section of Epic EMR chart  PRIOR [facility] CLINIC NOTES:  Located in [place]' section of Epic EMR chart  SURGERY NOTES:  Located in [place]' section of Epic EMR chart  PATHOLOGY:  Not applicable  Assessment and Plan:  DIAGNOSES:  Diagnoses and all orders for this visit:  Diverticulitis of large intestine with abscess without bleeding  Obesity (BMI 30-39.9), unspecified  OSA on CPAP    ASSESSMENT/PLAN  Pleasant obese gentleman with recurrent episodes of diverticulitis. More  complicated attack with contained abscesses that required 5-day hospitalization with IV antibiotics but eventually improved.  Given his recurrent attacks and now more complicated attack and relatively young age, I again recommended segmental colonic resection. Minimally invasive robotic approach to minimize complications and shorten hospital stay.   The anatomy & physiology of the digestive tract was discussed. The pathophysiology of the colon was discussed. Natural history risks without surgery was discussed. I feel the risks of no intervention will lead to serious problems that outweigh the operative risks; therefore, I recommended a partial colectomy to remove the pathology. Minimally invasive (Robotic/Laparoscopic) & open techniques were discussed.   Risks such as bleeding, infection, abscess, leak, reoperation, injury to other organs, need for repair of tissues / organs, possible ostomy, hernia, heart attack, stroke, death, and other risks were discussed. I noted a good likelihood this will help address the problem. Goals of post-operative recovery were discussed as well. Need for adequate nutrition, daily bowel regimen and healthy physical activity, to optimize recovery was noted as well. We will work to minimize complications. Educational materials were available as well. Questions were answered. The patient expresses understanding & wishes to proceed with surgery.  He is more than 3 years out from his last colonoscopy. Done yesterday by Dr Rush Landmark - underwhelming & reassuring   Adin Hector, MD, FACS, MASCRS Esophageal, Gastrointestinal & Colorectal Surgery Robotic and Minimally Invasive Surgery  [facility] [place] [address] [place], [place] [address] [phone] Fax [phone] Main  CONTACT INFORMATION:  Weekday (9AM-5PM): Call [facility] main office at [phone]  Weeknight (5PM-9AM) or Weekend/Holiday: Check  [email or web address] (password " [email or web address]") for General Surgery [facility] coverage  (Please, do not use SecureChat as it is not reliable communication to reach operating  surgeons for immediate patient care given surgeries/outpatient duties/clinic/cross-coverage/off post-call which would lead to a delay in care.  Epic staff messaging available for outptient concerns, but may not be answered for 48 hours or more).    09/22/2022

## 2022-09-22 NOTE — Op Note (Addendum)
09/22/2022  2:19 PM  PATIENT:  Roger Jennings  46 y.o. male  Patient Care Team: Patient, No Pcp Per as PCP - General (General Practice) Mansouraty, Telford Nab., MD as Consulting Physician (Gastroenterology) Deneise Lever, MD as Consulting Physician (Pulmonary Disease) Charlotte Crumb, MD as Consulting Physician (Orthopedic Surgery) Michael Boston, MD as Consulting Physician (General Surgery)  PRE-OPERATIVE DIAGNOSIS:  DIVERTICULITIS  POST-OPERATIVE DIAGNOSIS:  DIVERTICULITIS  PROCEDURE:   ROBOTIC ASSISTED RECTOSIGMOID RESECTION  RIGID PROCTOSCOPY TRANSVERSUS ABDOMINIS PLANE (TAP) BLOCK - BILATERAL INTRAOPERATIVE ASSESSMENT OF TISSUE VASCULAR PERFUSION USING ICG (indocyanine green) IMMUNOFLUORESCENCE  SURGEON:  Adin Hector, MD  ASSISTANT: Leighton Ruff, MD, FACS, FASCRS An experienced assistant was required given the standard of surgical care given the complexity of the case.  This assistant was needed for exposure, dissection, suction, tissue approximation, retraction, perception, etc.  ANESTHESIA:     General  Regional TRANSVERSUS ABDOMINIS PLANE (TAP) nerve block for perioperative & postoperative pain control provided with liposomal bupivacaine (Experel) mixed with 0.25% bupivacaine as a Bilateral TAP block x 32m each side at the level of the transverse abdominis & preperitoneal spaces along the flank at the anterior axillary line, from subcostal ridge to iliac crest under laparoscopic guidance   Local field block at port sites & extraction wound  EBL:  Total I/O In: 1100 [I.V.:1000; IV Piggyback:100] Out: 125 [Urine:100; Blood:25]  Delay start of Pharmacological VTE agent (>24hrs) due to surgical blood loss or risk of bleeding:  no  DRAINS: No  SPECIMEN:   RECTOSIGMOID COLON (open end proximal) DISTAL ANASTOMOTIC RING (final distal margin)  DISPOSITION OF SPECIMEN:  PATHOLOGY  COUNTS:  YES  PLAN OF CARE: Admit to inpatient   PATIENT DISPOSITION:  PACU  - hemodynamically stable.  INDICATION:    General with recurrent episodes of pain and inflammation consistent with sigmoid diverticulitis.  Repeated attacks.  More intense complicated tach with microperforation requiring hospitalization IV antibiotics.  Given the recurrent episodes and increased complexity of the tacks I recommended segmental resection:  The anatomy & physiology of the digestive tract was discussed.  The pathophysiology was discussed.  Natural history risks without surgery was discussed.   I worked to give an overview of the disease and the frequent need to have multispecialty involvement.  I feel the risks of no intervention will lead to serious problems that outweigh the operative risks; therefore, I recommended a partial colectomy to remove the pathology.  Laparoscopic & open techniques were discussed.   Risks such as bleeding, infection, abscess, leak, reoperation, possible ostomy, hernia, heart attack, death, and other risks were discussed.  I noted a good likelihood this will help address the problem.   Goals of post-operative recovery were discussed as well.  We will work to minimize complications.  Educational materials on the pathology had been given in the office.  Questions were answered.    The patient expressed understanding & wished to proceed with surgery.  OR FINDINGS:   Patient had chronically inflamed and foreshortened rectosigmoid colon.  Classic for diverticulitis.  No evidence of any colovesical fistula or major abscess.  No obvious metastatic disease on visceral parietal peritoneum or liver.  The anastomosis rests 14 cm from the anal verge by rigid proctoscopy.  CASE DATA:  Type of patient?: Elective [facility] Private Case  Status of Case? Elective Scheduled  Infection Present At Time Of Surgery (PATOS)?  PHLEGMON  DESCRIPTION:   Informed consent was confirmed.  The patient underwent general anaesthesia without difficulty.  The patient  was positioned  appropriately.  VTE prevention in place.  Patient underwent cystoscopy with ICG firefly cystoscopic injection of the ureters by Dr. Louis Meckel with [facility].  No evidence of any colovesical fistula.  Please see his separate operative note.  The patient was clipped, prepped, & draped in a sterile fashion.  Surgical timeout confirmed our plan.  The patient was positioned in reverse Trendelenburg.  Abdominal entry was gained using Varess technique at the left subcostal ridge on the anterior abdominal wall.  No elevated EtCO2 noted.  Ports placed.  Camera inspection revealed no injury.  Extra ports were carefully placed under direct laparoscopic visualization.  Upon entering the abdomen (organ space), I encountered a phlegmon involving the sigmoid colon .   I reflected the greater omentum and the upper abdomen the small bowel in the upper abdomen.  The patient was carefully positioned.  The Intuitive daVinci robot was docked with camera & instruments carefully placed.  I mobilized the rectosigmoid colon & elevated it to put the main pedicle on tension.  I scored the base of peritoneum of the medial side of the mesentery of the elevated left colon from the ligament of Treitz to the mid rectum.   I elevated the sigmoid mesentery and entered into the retro-mesenteric plane. We were able to identify the left ureter and gonadal vessels. We kept those posterior within the retroperitoneum and elevated the left colon mesentery off that. I did isolate the inferior mesenteric artery (IMA) pedicle but did not ligate it yet.  I continued distally and got into the avascular plane posterior to the mesorectum, sparing the nervi ergentes.. This allowed me to help mobilize the rectum as well by freeing the mesorectum off the sacrum.  I stayed away from the right and left ureters.  I kept the lateral vascular pedicles to the rectum intact.  I skeletonized the lymph nodes off the inferior mesenteric artery pedicle.   I went down to its takeoff from the aorta.  I isolated the inferior mesenteric vein off of the ligament of Treitz just cephalad to that as well.  After confirming the left ureter was out of the way, I went ahead and ligated the inferior mesenteric artery pedicle just near its takeoff from the aorta.  I did ligate the inferior mesenteric vein in a similar fashion.  We ensured hemostasis.  I continued medial to lateral dissection to free the left colon mesentery off the retroperitoneum going up towards the splenic flexure to allow good mobility and protect the colon mesentery.  I mobilized the left colon in a lateral to medial fashion off the retroperitoneum and sidewall attachments along the line of Toldt up towards the splenic flexure to ensure good mobilization of the remaining left colon to reach into the pelvis.  Ended up freeing off the splenic flexure in the retroperitoneal region and then freed off splenocolic adhesions as well for partial splenic flexure mobilization.  We then focused on mesorectal dissection.  Freed the mesorectum off the presacral plane until I was distal to the concerning region.  Freed off peritoneum on the lateral sidewalls as well and transected the mesentery of the lateral pedicles to get distal to the area of concern.  Came around anteriorly such that I had good circumferential mesorectal excision and a good margin distal to the area of concern.  I chose a region at the descending/sigmoid junction that was soft and easily reached down to the rectal stump.  I transected the mesentery of the colon  radially to preserve remaining colon blood supply.  I skeletonized the mesorectum and transected at the proximal rectum using a robotic stapler.  We then chose a region for the proximal margin that would reach well for our planned anastomosis.    We transected the colon mesentery radially to preserve good collateral and marginal artery blood supply.  To access vascular perfusion of  tissues, we asked anesthesia use intravenous  indocyanine green (ICG) with IV flush.  I switched to the NIR fluorescence (Firefly mode) imaging window on the daVinci robot platform.  We were able to see good light green visualization of blood vessels with good vascular perfusion of tissues, confirming good tissue perfusion of tissues (descending colon & rectum)  planned for anastomosis.  We created an extraction incision through a small Pfannenstiel incision in the suprapubic region.  Placed a wound protector.  I was able to eviscerate the rectosigmoid and descending colon out the wound.   I clamped the colon proximal to this area using a reusable pursestringer device.  Passed a 2-0 Keith needle. I transected at the descending/sigmoid junction with a scalpel. I got healthy bleeding mucosa.  We sent the rectosigmoid colon specimen off to go to pathology.  We sized the colon orifice.  I chose a 46m EEA anvil stapler system.  I reinforced the prolene pursestring with interrupted silk "belt loop" sutures.  I placed the anvil to the open end of the proximal remaining colon and closed around it using the pursestring.    We did copious irrigation with crystalloid solution.  Hemostasis was good.  The distal end of the remaining colon easily reached down to the rectal stump, therefore, splenic flexure mobilization was not needed.      Dr TMarcello Mooresscrubbed down and did gentle anal dilation and advanced the EEA stapler up the rectal stump. The spike was brought out at the provimal end of the rectal stump under direct visualization.  I  attached the anvil of the proximal colon the spike of the stapler. Anvil was tightened down and held clamped for 60 seconds.  Orientation was confirmed such that there is no twisting of the colon nor small bowel underneath the mesenteric defect. No concerning tension.  The EEA stapler was fired and held clamped for 30 seconds. The stapler was released & removed. Blue stitch is in the proximal  ring.  Care was taken to ensure no other structures were incorporated within this either.  We noted 2 excellent anastomotic rings.   The colon proximal to the anastomosis was then gently occluded. The pelvis was filled with sterile irrigation.  Dr TMarcello Moores did rigid proctoscopy noted the anastomosis was at 14 cm from the anal verge consistent with the proximal rectum.  There was a negative air leak test. There was no tension of mesentery or bowel at the anastomosis.   Tissues looked viable.  Ureters & bowel uninjured.  The anastomosis looked healthy. Greater omentum positioned down into the pelvis to help protect the anastomosis, it was rather foreshortened and only came down infraumbilically.  Held off on doing an omental pedicle flap.  I did not feel we needed to do a drain.  Endoluminal gas was evacuated.  Ports & wound protector removed.  We changed gloves & redraped the patient per colon SSI prevention protocol.  We aspirated the sterile irrigation.  Hemostasis was good.  Sterile unused instruments were used from this point.  I closed the skin at the port sites using Monocryl stitch and sterile  dressing.  We assured hemostasis and the former ostomy wound.  Wound irrigated.  I closed the posterior rectus fascia with 0 Vicryl suture.  Anterior rectus fascia was closed using #1 PDS transversely.  Dermis closed with running Monocryl transversely.  Sterile dressing placed.   Patient is being extubated go to recovery room. I had discussed postop care with the patient in detail the office & in the holding area. Instructions are written. I discussed operative findings, updated the patient's status, discussed probable steps to recovery, and gave postoperative recommendations to the patient's significant other, Roger Jennings .  Recommendations were made.  Questions were answered.  She expressed understanding & appreciation.  Adin Hector, M.D., F.A.C.S. Gastrointestinal and Minimally Invasive Surgery [facility] [address] [place], [place] [address] [phone] Main / Paging

## 2022-09-22 NOTE — Telephone Encounter (Signed)
Attempted to reach patient for post-procedure f/u call. No answer. Left message for him to please not hesitate to call us if he has any questions/concerns regarding his care. 

## 2022-09-22 NOTE — Consult Note (Signed)
WOC consult requested for presurgical stoma site marking; this was already performed on 12/7.  Please refer to that previous consult note for measurements.  Please re-consult if further assistance is needed.  Thank-you,  Julien Girt MSN, [place], [place], [place], [place]

## 2022-09-22 NOTE — Anesthesia Procedure Notes (Addendum)
Procedure Name: Intubation Date/Time: 09/22/2022 11:36 AM  Performed by: Victoriano Lain, CRNAPre-anesthesia Checklist: Patient identified, Emergency Drugs available, Suction available, Patient being monitored and Timeout performed Patient Re-evaluated:Patient Re-evaluated prior to induction Oxygen Delivery Method: Circle system utilized Preoxygenation: Pre-oxygenation with 100% oxygen Induction Type: IV induction Ventilation: Mask ventilation without difficulty Laryngoscope Size: Glidescope and 4 Grade View: Grade I Tube type: Parker flex tip Tube size: 7.5 mm Number of attempts: 1 Airway Equipment and Method: Rigid stylet and Video-laryngoscopy Placement Confirmation: ETT inserted through vocal cords under direct vision, positive ETCO2 and breath sounds checked- equal and bilateral Secured at: 23 cm Tube secured with: Tape Dental Injury: Teeth and Oropharynx as per pre-operative assessment  Difficulty Due To: Difficulty was anticipated, Difficult Airway- due to limited oral opening and Difficult Airway- due to anterior larynx Future Recommendations: Recommend- induction with short-acting agent, and alternative techniques readily available Comments: PreO2. Smooth IV induction. Difficult airway anticipated. DL X 1 with Glidescope #4. Slight difficulty was noted in placing the #4 blade in the pt's mouth due to his small mouth opening. Grade 1-2 view obtained with assistance by Dr Ermalene Postin. +ETCO2. BBS=. ATOI. ETT secured at 23 at the lip.

## 2022-09-23 ENCOUNTER — Other Ambulatory Visit: Payer: Self-pay

## 2022-09-23 ENCOUNTER — Encounter: Payer: Self-pay | Admitting: Gastroenterology

## 2022-09-23 ENCOUNTER — Encounter (HOSPITAL_COMMUNITY): Payer: Self-pay | Admitting: Surgery

## 2022-09-23 DIAGNOSIS — R7303 Prediabetes: Secondary | ICD-10-CM

## 2022-09-23 DIAGNOSIS — Z87442 Personal history of urinary calculi: Secondary | ICD-10-CM

## 2022-09-23 DIAGNOSIS — M109 Gout, unspecified: Secondary | ICD-10-CM

## 2022-09-23 DIAGNOSIS — F419 Anxiety disorder, unspecified: Secondary | ICD-10-CM

## 2022-09-23 DIAGNOSIS — N200 Calculus of kidney: Secondary | ICD-10-CM

## 2022-09-23 LAB — CBC
HCT: 42.5 % (ref 39.0–52.0)
Hemoglobin: 14.1 g/dL (ref 13.0–17.0)
MCH: 30.1 pg (ref 26.0–34.0)
MCHC: 33.2 g/dL (ref 30.0–36.0)
MCV: 90.8 fL (ref 80.0–100.0)
Platelets: 171 10*3/uL (ref 150–400)
RBC: 4.68 MIL/uL (ref 4.22–5.81)
RDW: 13.5 % (ref 11.5–15.5)
WBC: 12 10*3/uL — ABNORMAL HIGH (ref 4.0–10.5)
nRBC: 0 % (ref 0.0–0.2)

## 2022-09-23 LAB — BASIC METABOLIC PANEL
Anion gap: 9 (ref 5–15)
BUN: 10 mg/dL (ref 6–20)
CO2: 23 mmol/L (ref 22–32)
Calcium: 8.8 mg/dL — ABNORMAL LOW (ref 8.9–10.3)
Chloride: 107 mmol/L (ref 98–111)
Creatinine, Ser: 1.03 mg/dL (ref 0.61–1.24)
GFR, Estimated: >60 mL/min (ref >60)
Glucose, Bld: 113 mg/dL — ABNORMAL HIGH (ref 70–99)
Potassium: 3.9 mmol/L (ref 3.5–5.1)
Sodium: 139 mmol/L (ref 135–145)

## 2022-09-23 LAB — MAGNESIUM: Magnesium: 2 mg/dL (ref 1.7–2.4)

## 2022-09-23 MED ORDER — ALLOPURINOL 100 MG PO TABS
200.0000 mg | ORAL_TABLET | Freq: Every day | ORAL | Status: DC
  Administered 2022-09-23 – 2022-09-25 (×3): 200 mg via ORAL
  Filled 2022-09-23 (×3): qty 2

## 2022-09-23 MED ORDER — DICYCLOMINE HCL 20 MG PO TABS: 20.0000 mg | ORAL_TABLET | Freq: Three times a day (TID) | ORAL | Status: DC | PRN

## 2022-09-23 NOTE — Consult Note (Signed)
WOC Nurse Consult Note: Pt did not receive an ostomy yesterday.  Surgical team following for assessment and plan of care. No further role for WOC team. Please re-consult if further assistance is needed.  Thank-you,  Julien Girt MSN, [place], [place], [place], [place]

## 2022-09-23 NOTE — Progress Notes (Addendum)
Roger Jennings [id number] 08/29/1976  CARE TEAM:  PCP: Patient, No Pcp Per  Outpatient Care Team: Patient Care Team: Patient, No Pcp Per as PCP - General (General Practice) Mansouraty, Telford Nab., MD as Consulting Physician (Gastroenterology) Deneise Lever, MD as Consulting Physician (Pulmonary Disease) Charlotte Crumb, MD as Consulting Physician (Orthopedic Surgery) Michael Boston, MD as Consulting Physician (General Surgery)  Inpatient Treatment Team: Treatment Team: Attending Provider: Michael Boston, MD; [place] Nurse: Tenna Child, RN; Technician: Rutherford Nail, NT; Registered Nurse: Roselind Rily, RN; Registered Nurse: Kerney Elbe, RN; Pharmacist: Randa Spike, [facility]   Problem List:   Principal Problem:   Diverticulitis with h/o abscess s/p robotic colectomy 09/22/2022 Active Problems:   OSA on CPAP   Difficult airway for intubation   Obesity (BMI 30-39.9)   Anxiety   Gout   History of kidney stones   Pre-diabetes   Nephrolithiasis   1 Day Post-Op  09/22/2022   POST-OPERATIVE DIAGNOSIS:  DIVERTICULITIS   PROCEDURE:   ROBOTIC ASSISTED RECTOSIGMOID RESECTION  RIGID PROCTOSCOPY TRANSVERSUS ABDOMINIS PLANE (TAP) BLOCK - BILATERAL INTRAOPERATIVE ASSESSMENT OF TISSUE VASCULAR PERFUSION USING ICG (indocyanine green) IMMUNOFLUORESCENCE   SURGEON:  Adin Hector, MD  OR FINDINGS:    Patient had chronically inflamed and foreshortened rectosigmoid colon.  Classic for diverticulitis.  No evidence of any colovesical fistula or major abscess.   No obvious metastatic disease on visceral parietal peritoneum or liver.   The anastomosis rests 14 cm from the anal verge by rigid proctoscopy.     Assessment  Recovering  [facility] Stay = 1 days)  Plan: -f/u pathology -ERAS protocol -Adv diet -D/c foley (done) -OSA on CPAP - CPAP QHS. -difficult airway - no stridor & stable on RA - follow -follow off IVF -Hx gout - resume  allopurinol -VTE prophylaxis- SCDs, enoxaparin -mobilize as tolerated to help recovery  Disposition:  Disposition:  The patient is from: Home  Anticipate discharge to:  Home  Anticipated Date of Discharge is:  December 16,2023    Barriers to discharge:  Pending Clinical improvement (more likely than not)  Patient currently is NOT MEDICALLY STABLE for discharge from the hospital from a surgery standpoint.      I reviewed nursing notes, last 24 h vitals and pain scores, last 48 h intake and output, last 24 h labs and trends, and last 24 h imaging results. I have reviewed this patient's available data, including medical history, events of note, test results, etc as part of my evaluation.  A significant portion of that time was spent in counseling.  Care during the described time interval was provided by me.  This care required moderate level of medical decision making.  09/23/2022    Subjective: (Chief complaint)  Feeling better Foley out - relieved Tol clears Walked in hallways No nausea  Objective:  Vital signs:  Vitals:   09/22/22 2150 09/23/22 0100 09/23/22 0500 09/23/22 0508  BP: 120/79 116/69  116/74  Pulse: (!) 105 95  80  Resp: '16 16  16  '$ Temp: 98.6 F (37 C) 98.4 F (36.9 C)  97.9 F (36.6 C)  TempSrc: Oral Oral  Oral  SpO2: 96% 95%  97%  Weight:   104.3 kg     Last BM Date : 09/21/22  Intake/Output   Yesterday:  12/14 0701 - 12/15 0700 In: 2467.4 [P.O.:120; I.V.:2137.4; IV Piggyback:210] Out: 1025 [Urine:1000; Blood:25] This shift:  No intake/output data recorded.  Bowel function:  Flatus: YES  BM:  No  Drain: (No drain)   Physical Exam:  General: Pt awake/alert in no acute distress Eyes: PERRL, normal EOM.  Sclera clear.  No icterus Neuro: CN II-XII intact w/o focal sensory/motor deficits. Lymph: No head/neck/groin lymphadenopathy Psych:  No delerium/psychosis/paranoia.  Oriented x 4 HENT: Normocephalic, Mucus membranes moist.  No  thrush Neck: Supple, No tracheal deviation.  No obvious thyromegaly Chest: No pain to chest wall compression.  Good respiratory excursion.  No audible wheezing CV:  Pulses intact.  Regular rhythm.  No major extremity edema MS: Normal AROM mjr joints.  No obvious deformity  Abdomen: Soft.  Nondistended.  Mildly tender at incisions only.  No evidence of peritonitis.  No incarcerated hernias.  Ext:   No deformity.  No mjr edema.  No cyanosis Skin: No petechiae / purpurea.  No major sores.  Warm and dry    Results:   Cultures: No results found for this or any previous visit (from the past 720 hour(s)).  Labs: Results for orders placed or performed during the hospital encounter of 09/22/22 (from the past 48 hour(s))  ABO/Rh     Status: None   Collection Time: 09/22/22 10:11 AM  Result Value Ref Range   ABO/RH(D)      O POS Performed at [facility], [place] [address], [place], [place] [address]   Hemoglobin A1c     Status: None   Collection Time: 09/22/22 10:12 AM  Result Value Ref Range   Hgb A1c MFr Bld 5.6 4.8 - 5.6 %    Comment: (NOTE)         Prediabetes: 5.7 - 6.4         Diabetes: >6.4         Glycemic control for adults with diabetes: <7.0    Mean Plasma Glucose 114 mg/dL    Comment: (NOTE) Performed At: [facility] [place], [place] [id number] Rush Farmer MD XB:[id number]   Basic metabolic panel     Status: Abnormal   Collection Time: 09/23/22  5:33 AM  Result Value Ref Range   Sodium 139 135 - 145 mmol/L   Potassium 3.9 3.5 - 5.1 mmol/L   Chloride 107 98 - 111 mmol/L   CO2 23 22 - 32 mmol/L   Glucose, Bld 113 (H) 70 - 99 mg/dL    Comment: Glucose reference range applies only to samples taken after fasting for at least 8 hours.   BUN 10 6 - 20 mg/dL   Creatinine, Ser 1.03 0.61 - 1.24 mg/dL   Calcium 8.8 (L) 8.9 - 10.3 mg/dL   GFR, Estimated >60 >60 mL/min    Comment: (NOTE) Calculated using the CKD-EPI Creatinine  Equation (2021)    Anion gap 9 5 - 15    Comment: Performed at [facility], [place] [address] [place], [place] [address]  CBC     Status: Abnormal   Collection Time: 09/23/22  5:33 AM  Result Value Ref Range   WBC 12.0 (H) 4.0 - 10.5 K/uL   RBC 4.68 4.22 - 5.81 MIL/uL   Hemoglobin 14.1 13.0 - 17.0 g/dL   HCT 42.5 39.0 - 52.0 %   MCV 90.8 80.0 - 100.0 fL   MCH 30.1 26.0 - 34.0 pg   MCHC 33.2 30.0 - 36.0 g/dL   RDW 13.5 11.5 - 15.5 %   Platelets 171 150 - 400 K/uL   nRBC 0.0 0.0 - 0.2 %    Comment: Performed at  [facility], [place] [address] [place], [place] [address]  Magnesium     Status: None   Collection Time: 09/23/22  5:33 AM  Result Value Ref Range   Magnesium 2.0 1.7 - 2.4 mg/dL    Comment: Performed at [facility], [place] [address], [place], [place] [address]    Imaging / Studies: No results found.  Medications / Allergies: per chart  Antibiotics: Anti-infectives (From admission, onward)    Start     Dose/Rate Route Frequency Ordered Stop   09/22/22 2200  cefoTEtan (CEFOTAN) 2 g in sodium chloride 0.9 % 100 mL IVPB        2 g 200 mL/hr over 30 Minutes Intravenous Every 12 hours 09/22/22 1817 09/22/22 2204   09/22/22 1400  neomycin (MYCIFRADIN) tablet 1,000 mg  Status:  Discontinued       See Hyperspace for full Linked Orders Report.   1,000 mg Oral 3 times per day 09/22/22 0940 09/22/22 0948   09/22/22 1400  metroNIDAZOLE (FLAGYL) tablet 1,000 mg  Status:  Discontinued       See Hyperspace for full Linked Orders Report.   1,000 mg Oral 3 times per day 09/22/22 0940 09/22/22 0948   09/22/22 0945  cefoTEtan (CEFOTAN) 2 g in sodium chloride 0.9 % 100 mL IVPB        2 g 200 mL/hr over 30 Minutes Intravenous On call to O.R. 09/22/22 0940 09/22/22 1207         Note: Portions of this report may have been transcribed using voice recognition software. Every effort was made to ensure accuracy; however, inadvertent  computerized transcription errors may be present.   Any transcriptional errors that result from this process are unintentional.    Adin Hector, MD, FACS, MASCRS Esophageal, Gastrointestinal & Colorectal Surgery Robotic and Minimally Invasive Surgery  [facility] [place] [address] [place], [place] [address] [phone] Fax [phone] Main  CONTACT INFORMATION:  Weekday (9AM-5PM): Call [facility] main office at [phone]  Weeknight (5PM-9AM) or Weekend/Holiday: Check [email or web address] (password " [email or web address]") for General Surgery [facility] coverage  (Please, do not use SecureChat as it is not reliable communication to reach operating surgeons for immediate patient care given surgeries/outpatient duties/clinic/cross-coverage/off post-call which would lead to a delay in care.  Epic staff messaging available for outptient concerns, but may not be answered for 48 hours or more).     09/23/2022  7:20 AM

## 2022-09-23 NOTE — Progress Notes (Signed)
Mobility Specialist - Progress Note   09/23/22 0936  Mobility  Activity Ambulated independently in hallway  Level of Assistance Independent  Assistive Device None  Distance Ambulated (ft) 500 ft  Activity Response Tolerated well  Mobility Referral Yes  $Mobility charge 1 Mobility   Pt received in bed and agreeable to mobility. No complaints during mobility. Pt to room standing after session with all needs met.     [facility]

## 2022-09-23 NOTE — Anesthesia Postprocedure Evaluation (Signed)
Anesthesia Post Note  Patient: Roger Jennings  Procedure(s) Performed: ROBOTIC ASSISTED RESECTION OF COLON, RECTOSIGMOID RIGID PROCTOSCOPY CYSTOSCOPY with FIREFLY INJECTION     Patient location during evaluation: PACU Anesthesia Type: General Level of consciousness: awake and alert Pain management: pain level controlled Vital Signs Assessment: post-procedure vital signs reviewed and stable Respiratory status: spontaneous breathing, nonlabored ventilation and respiratory function stable Cardiovascular status: blood pressure returned to baseline and stable Postop Assessment: no apparent nausea or vomiting Anesthetic complications: yes  Encounter Notable Events  Notable Event Outcome Phase Comment  Difficult to intubate - expected  Intraprocedure Filed from anesthesia note documentation.    Last Vitals:  Vitals:   09/23/22 0817 09/23/22 1100  BP: (!) 148/79 117/63  Pulse: 84 80  Resp: 16 16  Temp: 36.7 C 36.5 C  SpO2: 95%     Last Pain:  Vitals:   09/23/22 1922  TempSrc:   PainSc: [place]

## 2022-09-23 NOTE — TOC CM/SW Note (Signed)
Transition of Care [facility]) Screening Note  Patient Details  Name: YAHYA BOLDMAN Date of Birth: 01-31-1976  Transition of Care [facility]) CM/SW Contact:    Sherie Don, LCSW Phone Number: 09/23/2022, 1:19 PM  Transition of Care Department [facility]) has reviewed patient and no TOC needs have been identified at this time. We will continue to monitor patient advancement through interdisciplinary progression rounds. If new patient transition needs arise, please place a TOC consult.

## 2022-09-24 NOTE — Progress Notes (Signed)
Mobility Specialist - Progress Note   09/24/22 0918  Mobility  Activity Ambulated independently in hallway  Level of Assistance Independent  Assistive Device None  Distance Ambulated (ft) 600 ft  Activity Response Tolerated well  Mobility Referral Yes  $Mobility charge 1 Mobility   Pt received in bed and agreeable to mobility. No complaints during session. Pt to bed after session with all needs met.    [facility]

## 2022-09-24 NOTE — Progress Notes (Signed)
Mobility Specialist - Progress Note   09/24/22 1441  Mobility  Activity Ambulated independently in hallway  Level of Assistance Independent  Assistive Device None  Distance Ambulated (ft) 500 ft  Activity Response Tolerated well  Mobility Referral Yes  $Mobility charge 1 Mobility   Pt received in bed and agreeable to mobility. No complaints during mobility. Pt to room standing after session with all needs met.     [facility] Mobility Specialist  

## 2022-09-24 NOTE — Progress Notes (Signed)
Roger Jennings [id number] 1976-04-01  CARE TEAM:  PCP: Patient, No Pcp Per  Outpatient Care Team: Patient Care Team: Patient, No Pcp Per as PCP - General (General Practice) Mansouraty, Telford Nab., MD as Consulting Physician (Gastroenterology) Deneise Lever, MD as Consulting Physician (Pulmonary Disease) Charlotte Crumb, MD as Consulting Physician (Orthopedic Surgery) Michael Boston, MD as Consulting Physician (General Surgery)  Inpatient Treatment Team: Treatment Team: Attending Provider: Michael Boston, MD; Mobility Specialist: Trevor Mace; Registered Nurse: Alva Garnet, RN; Utilization Review: Alanson Puls, RN; Case Manager: Addison Naegeli, RN; Pharmacist: Randa Spike, [facility]   Problem List:   Principal Problem:   Diverticulitis with h/o abscess s/p robotic colectomy 09/22/2022 Active Problems:   OSA on CPAP   Difficult airway for intubation   Obesity (BMI 30-39.9)   Anxiety   Gout   History of kidney stones   Pre-diabetes   Nephrolithiasis   2 Days Post-Op  09/22/2022   POST-OPERATIVE DIAGNOSIS:  DIVERTICULITIS   PROCEDURE:   ROBOTIC ASSISTED RECTOSIGMOID RESECTION  RIGID PROCTOSCOPY TRANSVERSUS ABDOMINIS PLANE (TAP) BLOCK - BILATERAL INTRAOPERATIVE ASSESSMENT OF TISSUE VASCULAR PERFUSION USING ICG (indocyanine green) IMMUNOFLUORESCENCE   SURGEON:  Adin Hector, MD  OR FINDINGS:    Patient had chronically inflamed and foreshortened rectosigmoid colon.  Classic for diverticulitis.  No evidence of any colovesical fistula or major abscess.   No obvious metastatic disease on visceral parietal peritoneum or liver.   The anastomosis rests 14 cm from the anal verge by rigid proctoscopy.     Assessment  Recovering  [facility] Stay = 2 days)  Plan: -f/u pathology [facility] -ERAS protocol -change to soft diet rather than dysphagia diet.  -foley out.   -OSA on CPAP - CPAP QHS. -difficult airway - no stridor & stable on RA -  follow -follow off IVF -Hx gout - resume allopurinol -VTE prophylaxis- SCDs, enoxaparin -continue ambulation  Disposition:  Disposition:  The patient is from: Home  Anticipate discharge to:  Home  Anticipated Date of Discharge is:  September 25, 2022    Barriers to discharge:  Pending Clinical improvement (more likely than not)  Patient currently is NOT MEDICALLY STABLE for discharge from the hospital from a surgery standpoint.      I reviewed nursing notes, last 24 h vitals and pain scores, last 48 h intake and output, last 24 h labs and trends, and last 24 h imaging results. I have reviewed this patient's available data, including medical history, events of note, test results, etc as part of my evaluation.  A significant portion of that time was spent in counseling.  Care during the described time interval was provided by me.  This care required moderate level of medical decision making.  09/24/2022    Subjective: (Chief complaint)  Pt having more pain today since block wore off.  No n/v.  Has ambulated.  Small amount of flatus, no BM yet.    Objective:  Vital signs:  Vitals:   09/23/22 1100 09/23/22 2050 09/24/22 0500 09/24/22 0618  BP: 117/63 (!) 140/84  117/73  Pulse: 80 70  70  Resp: '16 16  16  '$ Temp: 97.7 F (36.5 C) 98.2 F (36.8 C)  (!) 97.5 F (36.4 C)  TempSrc: Oral Oral  Oral  SpO2:  96%  98%  Weight:   103.8 kg     Last BM Date : 09/21/22  Intake/Output   Yesterday:  12/15 0701 - 12/16 0700 In: 912 [P.O.:912] Out: 1200 [Urine:1200]  This shift:  No intake/output data recorded.  Bowel function:  Flatus: YES  BM:  No  Drain: (No drain)   Physical Exam:  General: Pt awake/alert in no acute distress Eyes: PERRL, normal EOM.  Sclera clear.  No icterus Psych:  No delerium/psychosis/paranoia.  Oriented x 4 Chest: No pain to chest wall compression.  Good respiratory excursion.  No audible wheezing CV:  Pulses intact.  Regular rhythm.  No  major extremity edema Abdomen: Soft.  Nondistended.  Mildly tender at incisions only.  No evidence of peritonitis.  No incarcerated hernias. Ext:   No deformity.  No mjr edema.  No cyanosis Skin: No petechiae / purpurea.  No major sores.  Warm and dry    Results:   Cultures: No results found for this or any previous visit (from the past 720 hour(s)).  Labs: Results for orders placed or performed during the hospital encounter of 09/22/22 (from the past 48 hour(s))  ABO/Rh     Status: None   Collection Time: 09/22/22 10:11 AM  Result Value Ref Range   ABO/RH(D)      O POS Performed at [facility], [place] [address] [place], [place] [address]   Hemoglobin A1c     Status: None   Collection Time: 09/22/22 10:12 AM  Result Value Ref Range   Hgb A1c MFr Bld 5.6 4.8 - 5.6 %    Comment: (NOTE)         Prediabetes: 5.7 - 6.4         Diabetes: >6.4         Glycemic control for adults with diabetes: <7.0    Mean Plasma Glucose 114 mg/dL    Comment: (NOTE) Performed At: [facility] [place] [place], [place] [id number] Rush Farmer MD PY:[id number]   Basic metabolic panel     Status: Abnormal   Collection Time: 09/23/22  5:33 AM  Result Value Ref Range   Sodium 139 135 - 145 mmol/L   Potassium 3.9 3.5 - 5.1 mmol/L   Chloride 107 98 - 111 mmol/L   CO2 23 22 - 32 mmol/L   Glucose, Bld 113 (H) 70 - 99 mg/dL    Comment: Glucose reference range applies only to samples taken after fasting for at least 8 hours.   BUN 10 6 - 20 mg/dL   Creatinine, Ser 1.03 0.61 - 1.24 mg/dL   Calcium 8.8 (L) 8.9 - 10.3 mg/dL   GFR, Estimated >60 >60 mL/min    Comment: (NOTE) Calculated using the CKD-EPI Creatinine Equation (2021)    Anion gap 9 5 - 15    Comment: Performed at [facility], [place] [address], [place], [place] [address]  CBC     Status: Abnormal   Collection Time: 09/23/22  5:33 AM  Result Value Ref Range   WBC 12.0 (H) 4.0 - 10.5  K/uL   RBC 4.68 4.22 - 5.81 MIL/uL   Hemoglobin 14.1 13.0 - 17.0 g/dL   HCT 42.5 39.0 - 52.0 %   MCV 90.8 80.0 - 100.0 fL   MCH 30.1 26.0 - 34.0 pg   MCHC 33.2 30.0 - 36.0 g/dL   RDW 13.5 11.5 - 15.5 %   Platelets 171 150 - 400 K/uL   nRBC 0.0 0.0 - 0.2 %    Comment: Performed at [facility], [place] [address], [place], [place] [address]  Magnesium     Status: None   Collection Time: 09/23/22  5:33  AM  Result Value Ref Range   Magnesium 2.0 1.7 - 2.4 mg/dL    Comment: Performed at [facility], [place] [address] [place], [place] [address]    Imaging / Studies: No results found.  Medications / Allergies: per chart  Antibiotics: Anti-infectives (From admission, onward)    Start     Dose/Rate Route Frequency Ordered Stop   09/22/22 2200  cefoTEtan (CEFOTAN) 2 g in sodium chloride 0.9 % 100 mL IVPB        2 g 200 mL/hr over 30 Minutes Intravenous Every 12 hours 09/22/22 1817 09/22/22 2204   09/22/22 1400  neomycin (MYCIFRADIN) tablet 1,000 mg  Status:  Discontinued       See Hyperspace for full Linked Orders Report.   1,000 mg Oral 3 times per day 09/22/22 0940 09/22/22 0948   09/22/22 1400  metroNIDAZOLE (FLAGYL) tablet 1,000 mg  Status:  Discontinued       See Hyperspace for full Linked Orders Report.   1,000 mg Oral 3 times per day 09/22/22 0940 09/22/22 0948   09/22/22 0945  cefoTEtan (CEFOTAN) 2 g in sodium chloride 0.9 % 100 mL IVPB        2 g 200 mL/hr over 30 Minutes Intravenous On call to O.R. 09/22/22 0940 09/22/22 1207         Note: Portions of this report may have been transcribed using voice recognition software. Every effort was made to ensure accuracy; however, inadvertent computerized transcription errors may be present.   Any transcriptional errors that result from this process are unintentional.    Milus Height, MD [facility] Surgical Oncology, General Surgery, Trauma and [place] [facility],  [place] for weekday/non holidays Check [email or web address] for coverage night/weekend/holidays  Do not use SecureChat as it is not reliable for timely patient care.    [place] [address] [place], [place] [address] [phone] Fax [phone] Main  CONTACT INFORMATION:  Weekday (9AM-5PM): Call [facility] main office at [phone]  Weeknight (5PM-9AM) or Weekend/Holiday: Check [email or web address] (password " [email or web address]") for General Surgery [facility] coverage  (Please, do not use SecureChat as it is not reliable communication to reach operating surgeons for immediate patient care given surgeries/outpatient duties/clinic/cross-coverage/off post-call which would lead to a delay in care.  Epic staff messaging available for outptient concerns, but may not be answered for 48 hours or more).     09/24/2022  8:39 AM

## 2022-09-25 MED ORDER — TRAMADOL HCL 50 MG PO TABS: 50.0000 mg | ORAL_TABLET | Freq: Four times a day (QID) | ORAL | 0 refills | Status: DC | PRN

## 2022-09-25 MED ORDER — METHOCARBAMOL 500 MG PO TABS: 500.0000 mg | ORAL_TABLET | Freq: Four times a day (QID) | ORAL | 1 refills | Status: DC | PRN

## 2022-09-25 NOTE — Progress Notes (Signed)
Mobility Specialist - Progress Note   09/25/22 0912  Mobility  Activity Ambulated independently in hallway  Level of Assistance Independent  Assistive Device None  Distance Ambulated (ft) 500 ft  Activity Response Tolerated well  Mobility Referral Yes  $Mobility charge 1 Mobility   Pt received in bed and agreeable to mobility. No complaints during mobility session. Pt to bed after session with all needs met.    [facility]

## 2022-09-25 NOTE — TOC Initial Note (Addendum)
Transition of Care [facility]) - Initial/Assessment Note    Patient Details  Name: Roger Jennings MRN: [id number] Date of Birth: [birth year]  Transition of Care [facility]) CM/SW Contact:  [phone]  Henrietta Dine, RN Phone Number: 09/25/2022, 9:11 AM  Clinical Narrative:                 No PCP or insurance listed for pt; spoke w/ in room; the pt says his POC is his [place] [phone]); he says he does not have insurance or PCP; the pt says he is from home and plans to return at d/c; he says he has transportation; the pt denies IPV, food insecurity, and IPV; he also says he has a CPAP from [facility]; the pt says he does not wear glasses, hearing aids or dentures; he says he does not have any other DME or [facility] services; the pt says he would like a new CPAP; advised pt to contact [facility] agency that provided CPAP; he verbalized understanding and says he will also ask his pulmonary MD at his next appt; the pt agrees to receiving resources for social services and [facility]; pt given list of clinics and guide to social services; resource for social services also placed in d/c instructions; the pt will make his own appt for services; no TOC needs.d    Barriers to Discharge: No Barriers Identified   Patient Goals and CMS Choice        Expected Discharge Plan and Services           Expected Discharge Date: 09/25/22                                    Prior Living Arrangements/Services                       Activities of Daily Living Home Assistive Devices/Equipment: Hand-held shower hose ADL Screening (condition at time of admission) Patient's cognitive ability adequate to safely complete daily activities?: Yes Is the patient deaf or have difficulty hearing?: No Does the patient have difficulty seeing, even when wearing glasses/contacts?: No Does the patient have difficulty concentrating, remembering, or making decisions?:  No Patient able to express need for assistance with ADLs?: Yes Does the patient have difficulty dressing or bathing?: No Independently performs ADLs?: Yes (appropriate for developmental age) Does the patient have difficulty walking or climbing stairs?: No Weakness of Legs: None Weakness of Arms/Hands: None  Permission Sought/Granted                  Emotional Assessment              Admission diagnosis:  Sigmoid diverticulitis [K57.32] Patient Active Problem List   Diagnosis Date Noted   Anxiety 09/23/2022   Gout 09/23/2022   History of kidney stones 09/23/2022   Pre-diabetes 09/23/2022   Nephrolithiasis 09/23/2022   Difficult airway for intubation 09/22/2022   Obesity (BMI 30-39.9) 08/22/2022   AKI (acute kidney injury) ([place]) 08/06/2022   Under or uninsured 06/14/2019   Exposure to COVID-19 virus 05/02/2019   Decreased ROM of finger 05/11/2017   Pain in finger of right hand 05/11/2017   Diverticulitis with h/o abscess s/p robotic colectomy 09/22/2022 12/02/2014   Rectal bleeding 12/02/2014   OSA on CPAP 02/18/2010   PCP:  Patient, No Pcp Per Pharmacy:   [id number] [place]  [address] [place] [address] Phone: [phone] Fax: [phone]  [facility] [address] [address] Phone: [phone] Fax: [phone]     Social Determinants of Health (SDOH) Interventions    Readmission Risk Interventions     No data to display

## 2022-09-25 NOTE — Progress Notes (Signed)
Patient was d/cd to home via w/c accompanied by significant other. All personal belongings taken with patient. In no s/s of acute distress at the time of discharge.

## 2022-09-25 NOTE — Discharge Summary (Signed)
Physician Discharge Summary  Patient ID: Roger Jennings MRN: [id number] DOB/AGE: 1976-02-29 46 y.o.  Admit date: 09/22/2022 Discharge date: 09/25/2022  Admission Diagnoses: Diverticulitis OSA Ansiety Gout Pre diabetes  Discharge Diagnoses:  Principal Problem:   Diverticulitis with h/o abscess s/p robotic colectomy 09/22/2022 Active Problems:   OSA on CPAP   Difficult airway for intubation   Obesity (BMI 30-39.9)   Anxiety   Gout   History of kidney stones   Pre-diabetes   Nephrolithiasis   Discharged Condition: stable  Hospital Course:  Pt was admitted to the floor following robotic rectosigmoid colon resection 09/22/2022 by Dr. Johney Maine.  He received entereg.  ERAS colon protocol followed.  He did well with this.  He was able to ambulate.  He had good pain control with tramadol and robaxin.  He tolerated saline lock of IV fluids and foley removal.  He wasn't able to stand the dysphagia diet and was switched to soft/low fiber diet.  On POD 2-3 he started having bowel movements and had 4 prior to d/c.  He is discharged on POD 3 in stable condition.    Consults: None  Significant Diagnostic Studies: labs: Cr 1.03, HCT 42.5 prior to d/c.    Treatments: surgery: see above  Discharge Exam: Blood pressure 118/81, pulse 76, temperature 97.7 F (36.5 C), temperature source Oral, resp. rate 17, weight 103.8 kg, SpO2 97 %. General appearance: alert, cooperative, and no distress Resp: breathing comfortably GI: soft, non distended, approp tender.   Extremities: extremities normal, atraumatic, no cyanosis or edema  Disposition: Discharge disposition: 01-Home or Self Care       Discharge Instructions     Call MD for:  difficulty breathing, headache or visual disturbances   Complete by: As directed    Call MD for:  hives   Complete by: As directed    Call MD for:  persistant nausea and vomiting   Complete by: As directed    Call MD for:  redness, tenderness, or signs of  infection (pain, swelling, redness, odor or green/yellow discharge around incision site)   Complete by: As directed    Call MD for:  severe uncontrolled pain   Complete by: As directed    Call MD for:  temperature >100.4   Complete by: As directed    Diet - low sodium heart healthy   Complete by: As directed    Increase activity slowly   Complete by: As directed       Allergies as of 09/25/2022       Reactions   Indomethacin Er Other (See Comments)   Gastrointestinal upset        Medication List     TAKE these medications    acetaminophen 500 MG tablet Commonly known as: TYLENOL Take 1,000 mg by mouth every 6 (six) hours as needed for moderate pain.   allopurinol 100 MG tablet Commonly known as: ZYLOPRIM TAKE 2 TABLETS BY MOUTH EVERY DAY   dicyclomine 20 MG tablet Commonly known as: BENTYL Take 1 tablet (20 mg total) by mouth 3 (three) times daily as needed for spasms.   methocarbamol 500 MG tablet Commonly known as: ROBAXIN Take 1-2 tablets (500-1,000 mg total) by mouth every 6 (six) hours as needed for muscle spasms.   traMADol 50 MG tablet Commonly known as: ULTRAM Take 1-2 tablets (50-100 mg total) by mouth every 6 (six) hours as needed for moderate pain.        Follow-up Information     Gross,  Remo Lipps, MD Follow up on 10/17/2022.   Specialties: General Surgery, Colon and Rectal Surgery Why: To follow up after your operation, To follow up after your hospital stay Contact information: [place] [address] [phone]                 Signed: Stark Klein 09/25/2022, 8:40 AM

## 2022-09-25 NOTE — Plan of Care (Signed)
Patient in no s/s of acute distress  at this time. C/o  2/10 surgical pain. Pain well controlled with med. Had another BM this morning. Ambulated in hallway independently. For discharge to home. Discharge instructions given and explained and pt verbalized understanding. Per pt, his GF will be here to pick her up at lunchtime.

## 2022-09-26 LAB — SURGICAL PATHOLOGY

## 2022-09-26 NOTE — Op Note (Signed)
Preoperative diagnosis:  Pelvic Abscess   Diverticulitis Postoperative diagnosis:  Same   Procedure: Cystoscopy Instillation of ureteral firefly constrast   Surgeon: Ardis Hughs, MD   Anesthesia: General   Complications: None   Intraoperative findings: normal bladder, UOs orthotopic.   EBL: Minimal   Specimens: None   Indication:  Roger Jennings  is a 46 y.o.  patient with diverticular disease.  Dr. Johney Maine requested cystoscopy and instillation of firefly contrast to help facilitate the dissection of the sigmoid colon.  After reviewing the management options for treatment, he elected to proceed with the above surgical procedure(s). We have discussed the potential benefits and risks of the procedure, side effects of the proposed treatment, the likelihood of the patient achieving the goals of the procedure, and any potential problems that might occur during the procedure or recuperation. Informed consent has been obtained.   Description of procedure:   The patient was taken to the operating room and general anesthesia was induced.  The patient was placed in the dorsal lithotomy position, prepped and draped in the usual sterile fashion, and preoperative antibiotics were administered. A preoperative time-out was performed.    A 21 French 30 degree cystoscope was gently passed through the patient's urethra into the bladder.  The bladder was subsequently emptied and then filled slowly up performing a 360 degrees cystoscopic evaluation.  This demonstrated orthotopic ureteral orifices, normal bladder mucosa with an area of heaped mucosa and bullous edema in the dome -  evidence of colovesical fistula without mucosal abnormality.   I then advanced a 5 [place] open-ended ureteral catheter into the patient's left ureteral orifice and  I then advanced the catheter up into the proximal ureter and then slowly pulled back and injected 7.75m of the firefly contrast.  Subsequently turned my  attention to the patient's right ureteral orifice and performed a similar task.   I then  placed a 16 [place].    The surgery was then turned over to Dr. GJohney Mainefor facilitation of the remainder of the case.

## 2022-11-16 ENCOUNTER — Ambulatory Visit: Payer: Self-pay | Admitting: Nurse Practitioner

## 2022-12-04 NOTE — Progress Notes (Unsigned)
New Patient Office Visit  Subjective    Patient ID: Roger Jennings, male    DOB: [birth year]  Age: 47 y.o. MRN: [id number]  CC: No chief complaint on file.   HPI Roger Jennings presents to establish care Admit date: 09/22/2022 Discharge date: 09/25/2022   Admission Diagnoses: Diverticulitis OSA Ansiety Gout Pre diabetes   Discharge Diagnoses:  Principal Problem:   Diverticulitis with h/o abscess s/p robotic colectomy 09/22/2022 Active Problems:   OSA on CPAP   Difficult airway for intubation   Obesity (BMI 30-39.9)   Anxiety   Gout   History of kidney stones   Pre-diabetes   Nephrolithiasis  Wants new cpap machine self pay  Outpatient Encounter Medications as of 12/06/2022  Medication Sig   acetaminophen (TYLENOL) 500 MG tablet Take 1,000 mg by mouth every 6 (six) hours as needed for moderate pain.   allopurinol (ZYLOPRIM) 100 MG tablet TAKE 2 TABLETS BY MOUTH EVERY DAY   dicyclomine (BENTYL) 20 MG tablet Take 1 tablet (20 mg total) by mouth 3 (three) times daily as needed for spasms. (Patient not taking: Reported on 09/12/2022)   methocarbamol (ROBAXIN) 500 MG tablet Take 1-2 tablets (500-1,000 mg total) by mouth every 6 (six) hours as needed for muscle spasms.   traMADol (ULTRAM) 50 MG tablet Take 1-2 tablets (50-100 mg total) by mouth every 6 (six) hours as needed for moderate pain.   No facility-administered encounter medications on file as of 12/06/2022.    Past Medical History:  Diagnosis Date   Allergy    Anxiety    Closed nondisplaced fracture of neck of fifth metacarpal bone of right hand 05/08/2017   Diverticulosis    Gout    History of kidney stones    Hx of diverticulitis of colon 06/14/2019   Internal hemorrhoids    Nephrolithiasis    OSA on CPAP    Pre-diabetes     Past Surgical History:  Procedure Laterality Date   NO PAST SURGERIES     PROCTOSCOPY N/A 09/22/2022   Procedure: RIGID PROCTOSCOPY;  Surgeon: Michael Boston, MD;  Location: [facility]  [facility];  Service: General;  Laterality: N/A;    Family History  Problem Relation Age of Onset   Diabetes Mellitus II Mother    Fibromyalgia Mother    Hypertension Father    Colon polyps Father    Coronary artery disease Paternal Grandmother    Colon cancer Neg Hx    Esophageal cancer Neg Hx    Gallbladder disease Neg Hx    Rectal cancer Neg Hx    Stomach cancer Neg Hx    Inflammatory bowel disease Neg Hx    Liver disease Neg Hx    Pancreatic cancer Neg Hx     Social History   Socioeconomic History   Marital status: Single    Spouse name: Not on file   Number of children: Not on file   Years of education: Not on file   Highest education level: Not on file  Occupational History   Occupation: employed    Comment: [employer]  Tobacco Use   Smoking status: Former    Types: Cigarettes    Quit date: 10/10/2001    Years since quitting: 21.1   Smokeless tobacco: Never  Vaping Use   Vaping Use: Never used  Substance and Sexual Activity   Alcohol use: No    Alcohol/week: 0.0 standard drinks of alcohol    Comment: rare   Drug use: No   Sexual  activity: Yes  Other Topics Concern   Not on file  Social History Narrative   Not on file   Social Determinants of Health   Financial Resource Strain: Not on file  Food Insecurity: No Food Insecurity (09/23/2022)   Hunger Vital Sign    Worried About Running Out of Food in the Last Year: Never true    Ran Out of Food in the Last Year: Never true  Transportation Needs: No Transportation Needs (09/23/2022)   PRAPARE - Hydrologist (Medical): No    Lack of Transportation (Non-Medical): No  Physical Activity: Not on file  Stress: Not on file  Social Connections: Not on file  Intimate Partner Violence: Not At Risk (09/23/2022)   Humiliation, Afraid, Rape, and Kick questionnaire    Fear of Current or Ex-Partner: No    Emotionally Abused: No    Physically Abused: No    Sexually Abused: No     ROS      Objective    There were no vitals taken for this visit.  Physical Exam  {Labs (Optional):23779}    Assessment & Plan:   Problem List Items Addressed This Visit   None   No follow-ups on file.   Asencion Noble, MD

## 2022-12-06 ENCOUNTER — Telehealth: Payer: Self-pay

## 2022-12-06 ENCOUNTER — Ambulatory Visit: Payer: Self-pay | Attending: Critical Care Medicine | Admitting: Critical Care Medicine

## 2022-12-06 ENCOUNTER — Encounter: Payer: Self-pay | Admitting: Critical Care Medicine

## 2022-12-06 VITALS — BP 134/85 | HR 78 | Ht 70.0 in | Wt 242.6 lb

## 2022-12-06 DIAGNOSIS — Z139 Encounter for screening, unspecified: Secondary | ICD-10-CM

## 2022-12-06 DIAGNOSIS — K5720 Diverticulitis of large intestine with perforation and abscess without bleeding: Secondary | ICD-10-CM

## 2022-12-06 DIAGNOSIS — G4733 Obstructive sleep apnea (adult) (pediatric): Secondary | ICD-10-CM

## 2022-12-06 NOTE — Patient Instructions (Signed)
A new cpap machine will be ordered  Labs today cholesterol panel  Focus on healthy diet see attached  avoid nuts/seeds  Return Dr Joya Gaskins 4 months

## 2022-12-06 NOTE — Telephone Encounter (Signed)
Order for CPAP machine placed but the patient is uninsured.   I met with him when he was in the clinic and he explained that he works but does not have insurance and he was told that he would not qualify for Medicaid.  He is concerned about his medical bills from his recent hospitalizations and despite working, he is not able to afford the cost of a new CPAP machine.    I explained that the American Sleep [employer]nea Association (ASAA) has a CPAP Assistance Program. They provide refurbished machines to individuals for a $200 program fee. There is no warranty or technical support form the ASAA that comes with the machine, it is provided " as is."    He said he  is not able to afford the program fee at this time. I explained  that [facility] has funding available to assist with this cost.  I explained that the  CPAP machine will be delivered to [facility]. I then will schedule him to meet with a respiratory technician at the [facility] to review the use, cleaning and care of the machine  When he picks up the machine, he will be asked to sign a waiver of liability for the ASAA . I also explained that I do not have a time frame for the delivery of the machine. It could possibly take 3+ months. He said he understood and would like to submit the application to the [facility].   He also requested a written prescription in the event that he is able to find a reasonably priced CPAP machine on the internet  I asked that he contact me to cancel the order with the ASAA if he finds another machine and he said he would.  I provided him with information about the Mask Program with the ASAA to he can purchase replacement masks.    The order for the CPAP machine was faxed to the [facility].

## 2022-12-06 NOTE — Assessment & Plan Note (Signed)
He has healed well and is doing well

## 2022-12-06 NOTE — Assessment & Plan Note (Signed)
Needs CPAP machine replaced  He is uninsured we will see if the sleep apnea Association can assist him he needs a setting of 9 cm water pressure and nasal pillows

## 2022-12-07 ENCOUNTER — Other Ambulatory Visit: Payer: Self-pay

## 2022-12-07 ENCOUNTER — Other Ambulatory Visit: Payer: Self-pay | Admitting: Critical Care Medicine

## 2022-12-07 DIAGNOSIS — E782 Mixed hyperlipidemia: Secondary | ICD-10-CM

## 2022-12-07 LAB — LIPID PANEL
Chol/HDL Ratio: 5.2 ratio — ABNORMAL HIGH (ref 0.0–5.0)
Cholesterol, Total: 240 mg/dL — ABNORMAL HIGH (ref 100–199)
HDL: 46 mg/dL (ref >39)
LDL Chol Calc (NIH): 148 mg/dL — ABNORMAL HIGH (ref 0–99)
Triglycerides: 253 mg/dL — ABNORMAL HIGH (ref 0–149)
VLDL Cholesterol Cal: 46 mg/dL — ABNORMAL HIGH (ref 5–40)

## 2022-12-07 MED ORDER — ATORVASTATIN CALCIUM 10 MG PO TABS
10.0000 mg | ORAL_TABLET | Freq: Every day | ORAL | 3 refills | Status: AC
  Filled 2022-12-07: qty 90, 90d supply, fill #0
  Filled 2023-03-15 – 2023-03-17 (×2): qty 90, 90d supply, fill #1
  Filled 2023-06-20: qty 90, 90d supply, fill #2
  Filled 2023-09-24 – 2023-09-25 (×2): qty 90, 90d supply, fill #3

## 2022-12-07 NOTE — Progress Notes (Signed)
Let pt know cholesterol very high I sent low dose cholesterol pill to our pharmacy He should follow low fat plant based diet

## 2022-12-08 ENCOUNTER — Telehealth: Payer: Self-pay

## 2022-12-08 ENCOUNTER — Other Ambulatory Visit: Payer: Self-pay

## 2022-12-08 NOTE — Telephone Encounter (Signed)
Pt was called and is aware of results, DOB was confirmed.  Information on healthy eat was sent to email

## 2022-12-08 NOTE — Telephone Encounter (Signed)
-----   Message from Elsie Stain, MD sent at 12/07/2022  5:55 AM EST ----- Let pt know cholesterol very high I sent low dose cholesterol pill to our pharmacy He should follow low fat plant based diet

## 2022-12-13 ENCOUNTER — Other Ambulatory Visit: Payer: Self-pay | Admitting: Podiatry

## 2022-12-14 NOTE — Telephone Encounter (Signed)
Message received from the American [employer] stating:   We will need an updated application for Roger Jennings to move forward. I will keep the application you submitted as the "Rx" as it does have the Dr. Serena Croissant and the pressure setting needed.   The program fee has increased to $200.  I called the patient and informed him that an updated application is needed.  I completed the application with him and obtained his verbal permission to submit the application. I noted on the application that they are to call me when the program fee is due because the clinic will be paying the program fee for the patient.  Updated application faxed to [facility].

## 2022-12-29 NOTE — Telephone Encounter (Signed)
The $200 donation fee for the BiPAP machine was made today. Will wait for delivery of the machine and then notify [facility] of need for patient education.

## 2023-01-03 NOTE — Telephone Encounter (Signed)
I called the patient and confirmed that the CPAP machine was delivered to the clinic and I will taking it to [facility]. They will call him to schedule an appointment to instruct him on the use of the machine as well as the care and maintenance of the machine.  He was very appreciative of the assistance he received.

## 2023-01-03 NOTE — Telephone Encounter (Signed)
Pt called in requesting to speak with [place]. Opal Sidles stated a CPAP machine was delivered today and wants to know whats next.   Please advise.

## 2023-01-10 ENCOUNTER — Telehealth: Payer: Self-pay | Admitting: General Practice

## 2023-01-10 NOTE — Telephone Encounter (Signed)
Copied from [place] [phone]. Topic: General - Inquiry >> Jan 10, 2023 11:19 AM Penni Bombard wrote: Reason for CRM: pt would like Opal Sidles to call him back .  It was about a CPAP machine.  CB@  [phone]

## 2023-01-10 NOTE — Telephone Encounter (Signed)
Call returned to patient and informed him that I have taken the CPAP machine to [facility] and they will be contacting him to schedule the teaching session.

## 2023-01-10 NOTE — Telephone Encounter (Signed)
CPAP machine delivered to [facility]

## 2023-01-20 ENCOUNTER — Ambulatory Visit (HOSPITAL_BASED_OUTPATIENT_CLINIC_OR_DEPARTMENT_OTHER): Payer: Self-pay | Attending: Internal Medicine

## 2023-03-15 ENCOUNTER — Other Ambulatory Visit: Payer: Self-pay

## 2023-03-17 ENCOUNTER — Other Ambulatory Visit: Payer: Self-pay

## 2023-03-17 ENCOUNTER — Other Ambulatory Visit (HOSPITAL_COMMUNITY): Payer: Self-pay

## 2023-04-23 NOTE — Progress Notes (Unsigned)
New Patient Office Visit  Subjective    Patient ID: Roger Jennings, male    DOB: Sep 22, 1976  Age: 47 y.o. MRN: [id number]  CC:  No chief complaint on file.   HPI 11/2022 Roger Jennings presents to establish care This patient originally was admitted in December for a perforated diverticulitis with abscess.  He underwent surgical procedure for drainage and is healed up well now off all pain medications and back to normal diet.  Below is a copy of the discharge summary. Admit date: 09/22/2022 Discharge date: 09/25/2022   Admission Diagnoses: Diverticulitis OSA Ansiety Gout Pre diabetes   Discharge Diagnoses:  Principal Problem:   Diverticulitis with h/o abscess s/p robotic colectomy 09/22/2022 Active Problems:   OSA on CPAP   Difficult airway for intubation   Obesity (BMI 30-39.9)   Anxiety   Gout   History of kidney stones   Pre-diabetes   Nephrolithiasis Hospital Course:  Pt was admitted to the floor following robotic rectosigmoid colon resection 09/22/2022 by Dr. Michaell Cowing.  He received entereg.  ERAS colon protocol followed.  He did well with this.  He was able to ambulate.  He had good pain control with tramadol and robaxin.  He tolerated saline lock of IV fluids and foley removal.  He wasn't able to stand the dysphagia diet and was switched to soft/low fiber diet.  On POD 2-3 he started having bowel movements and had 4 prior to d/c.  He is discharged on POD 3 in stable condition.     Consults: None   Significant Diagnostic Studies: labs: Cr 1.03, HCT 42.5 prior to d/c.     Treatments: surgery: see above   Discharge Exam: Blood pressure 118/81, pulse 76, temperature 97.7 F (36.5 C), temperature source Oral, resp. rate 17, weight 103.8 kg, SpO2 97 %. General appearance: alert, cooperative, and no distress Resp: breathing comfortably GI: soft, non distended, approp tender.   Extremities: extremities normal, atraumatic, no cyanosis or edema   Disposition: Discharge  disposition: 01-Home or Self Care Wants new cpap machine self pay  As part of this admission the patient had a difficult airway difficult time intubating.  He has a CPAP machine and is followed by Dr. Maple Hudson but has not been seen in over 10 years.  He has a setting of 9 cm water pressure uses nasal pillows.  Machine is wearing down he would like to get a new machine but he is self-pay.  He has no other complaints at this visit.  No other significant past history is noted.  Blood pressure on arrival good 134/85.  05/05/23  Outpatient Encounter Medications as of 04/25/2023  Medication Sig   atorvastatin (LIPITOR) 10 MG tablet Take 1 tablet (10 mg total) by mouth daily.   allopurinol (ZYLOPRIM) 100 MG tablet TAKE 2 TABLETS BY MOUTH EVERY DAY   No facility-administered encounter medications on file as of 04/25/2023.    Past Medical History:  Diagnosis Date   Allergy    Anxiety    Closed nondisplaced fracture of neck of fifth metacarpal bone of right hand 05/08/2017   Diverticulosis    Gout    History of kidney stones    Hx of diverticulitis of colon 06/14/2019   Internal hemorrhoids    Nephrolithiasis    OSA on CPAP    Pre-diabetes     Past Surgical History:  Procedure Laterality Date   NO PAST SURGERIES     PROCTOSCOPY N/A 09/22/2022   Procedure: RIGID PROCTOSCOPY;  Surgeon: Karie Soda, MD;  Location: [facility];  Service: General;  Laterality: N/A;    Family History  Problem Relation Age of Onset   Diabetes Mellitus II Mother    Fibromyalgia Mother    Hypertension Father    Colon polyps Father    Coronary artery disease Paternal Grandmother    Colon cancer Neg Hx    Esophageal cancer Neg Hx    Gallbladder disease Neg Hx    Rectal cancer Neg Hx    Stomach cancer Neg Hx    Inflammatory bowel disease Neg Hx    Liver disease Neg Hx    Pancreatic cancer Neg Hx     Social History   Socioeconomic History   Marital status: Single    Spouse name: Not on file   Number of  children: Not on file   Years of education: Not on file   Highest education level: Not on file  Occupational History   Occupation: employed    Comment: [employer]  Tobacco Use   Smoking status: Former    Current packs/day: 0.00    Types: Cigarettes    Quit date: 10/10/2001    Years since quitting: 21.5   Smokeless tobacco: Never  Vaping Use   Vaping status: Never Used  Substance and Sexual Activity   Alcohol use: No    Alcohol/week: 0.0 standard drinks of alcohol    Comment: rare   Drug use: No   Sexual activity: Yes  Other Topics Concern   Not on file  Social History Narrative   Not on file   Social Determinants of Health   Financial Resource Strain: Not on file  Food Insecurity: No Food Insecurity (09/23/2022)   Hunger Vital Sign    Worried About Running Out of Food in the Last Year: Never true    Ran Out of Food in the Last Year: Never true  Transportation Needs: No Transportation Needs (09/23/2022)   PRAPARE - Administrator, Civil Service (Medical): No    Lack of Transportation (Non-Medical): No  Physical Activity: Not on file  Stress: Not on file  Social Connections: Not on file  Intimate Partner Violence: Not At Risk (09/23/2022)   Humiliation, Afraid, Rape, and Kick questionnaire    Fear of Current or Ex-Partner: No    Emotionally Abused: No    Physically Abused: No    Sexually Abused: No    Review of Systems  Constitutional:  Negative for chills, diaphoresis, fever, malaise/fatigue and weight loss.  HENT:  Negative for congestion, ear discharge, ear pain, hearing loss, nosebleeds, sore throat and tinnitus.   Eyes:  Negative for blurred vision, double vision, photophobia and discharge.  Respiratory:  Negative for cough, hemoptysis, sputum production, shortness of breath, wheezing and stridor.        No excess mucus  Cardiovascular:  Negative for chest pain, palpitations, orthopnea, claudication, leg swelling and PND.  Gastrointestinal:   Negative for abdominal pain, blood in stool, constipation, diarrhea, heartburn, melena, nausea and vomiting.  Genitourinary:  Negative for dysuria, flank pain, frequency, hematuria and urgency.  Musculoskeletal:  Negative for back pain, falls, joint pain, myalgias and neck pain.  Skin:  Negative for itching and rash.  Neurological:  Negative for dizziness, tingling, tremors, sensory change, speech change, focal weakness, seizures, loss of consciousness, weakness and headaches.  Endo/Heme/Allergies:  Negative for environmental allergies and polydipsia. Does not bruise/bleed easily.  Psychiatric/Behavioral:  Negative for depression, hallucinations, memory loss, substance abuse and suicidal ideas.  The patient is not nervous/anxious and does not have insomnia.   All other systems reviewed and are negative.       Objective    There were no vitals taken for this visit.  Physical Exam Vitals reviewed.  Constitutional:      Appearance: Normal appearance. He is well-developed. He is obese. He is not diaphoretic.  HENT:     Head: Normocephalic and atraumatic.     Nose: No nasal deformity, septal deviation, mucosal edema or rhinorrhea.     Right Sinus: No maxillary sinus tenderness or frontal sinus tenderness.     Left Sinus: No maxillary sinus tenderness or frontal sinus tenderness.     Mouth/Throat:     Pharynx: No oropharyngeal exudate.     Comments: Extremely small oral cavity and micrognathia large tongue Eyes:     General: No scleral icterus.    Conjunctiva/sclera: Conjunctivae normal.     Pupils: Pupils are equal, round, and reactive to light.  Neck:     Thyroid: No thyromegaly.     Vascular: No carotid bruit or JVD.     Trachea: Trachea normal. No tracheal tenderness or tracheal deviation.  Cardiovascular:     Rate and Rhythm: Normal rate and regular rhythm.     Chest Wall: PMI is not displaced.     Pulses: Normal pulses. No decreased pulses.     Heart sounds: Normal heart  sounds, S1 normal and S2 normal. Heart sounds not distant. No murmur heard.    No systolic murmur is present.     No diastolic murmur is present.     No friction rub. No gallop. No S3 or S4 sounds.  Pulmonary:     Effort: No tachypnea, accessory muscle usage or respiratory distress.     Breath sounds: No stridor. No decreased breath sounds, wheezing, rhonchi or rales.  Chest:     Chest wall: No tenderness.  Abdominal:     General: Bowel sounds are normal. There is no distension.     Palpations: Abdomen is soft. Abdomen is not rigid.     Tenderness: There is no abdominal tenderness. There is no guarding or rebound.  Musculoskeletal:        General: Normal range of motion.     Cervical back: Normal range of motion and neck supple. No edema, erythema or rigidity. No muscular tenderness. Normal range of motion.  Lymphadenopathy:     Head:     Right side of head: No submental or submandibular adenopathy.     Left side of head: No submental or submandibular adenopathy.     Cervical: No cervical adenopathy.  Skin:    General: Skin is warm and dry.     Coloration: Skin is not pale.     Findings: No rash.     Nails: There is no clubbing.  Neurological:     Mental Status: He is alert and oriented to person, place, and time.     Sensory: No sensory deficit.  Psychiatric:        Speech: Speech normal.        Behavior: Behavior normal.         Assessment & Plan:   Problem List Items Addressed This Visit   None    No follow-ups on file.   Shan Levans, MD   HPI

## 2023-04-24 ENCOUNTER — Other Ambulatory Visit: Payer: Self-pay | Admitting: Podiatry

## 2023-04-25 ENCOUNTER — Other Ambulatory Visit: Payer: Self-pay | Admitting: Podiatry

## 2023-04-25 ENCOUNTER — Encounter: Payer: Self-pay | Admitting: Critical Care Medicine

## 2023-04-25 ENCOUNTER — Ambulatory Visit: Payer: Self-pay | Attending: Critical Care Medicine | Admitting: Critical Care Medicine

## 2023-04-25 ENCOUNTER — Telehealth: Payer: Self-pay

## 2023-04-25 VITALS — BP 127/84 | HR 69 | Wt 254.4 lb

## 2023-04-25 DIAGNOSIS — K5720 Diverticulitis of large intestine with perforation and abscess without bleeding: Secondary | ICD-10-CM

## 2023-04-25 DIAGNOSIS — M1A49X0 Other secondary chronic gout, multiple sites, without tophus (tophi): Secondary | ICD-10-CM

## 2023-04-25 DIAGNOSIS — E782 Mixed hyperlipidemia: Secondary | ICD-10-CM

## 2023-04-25 DIAGNOSIS — R7303 Prediabetes: Secondary | ICD-10-CM

## 2023-04-25 DIAGNOSIS — G4733 Obstructive sleep apnea (adult) (pediatric): Secondary | ICD-10-CM

## 2023-04-25 MED ORDER — ALLOPURINOL 100 MG PO TABS: 200.0000 mg | ORAL_TABLET | Freq: Every day | ORAL | 0 refills | Status: DC

## 2023-04-25 NOTE — Patient Instructions (Signed)
No medication changes  Increase Cpap to 14 cmH20 on dial from  9  [employer] message in 3-4 weeks on how you are doing  Return Dr Delford Field 4 months

## 2023-04-25 NOTE — Telephone Encounter (Signed)
Spoke to patient this morning, [facility] sent refill request for allopurinol - Patient states that he only has 2 pills left and is hoping you will refill today - last office visit was September 2023

## 2023-04-26 LAB — LIPID PANEL
Chol/HDL Ratio: 3.7 ratio (ref 0.0–5.0)
Cholesterol, Total: 166 mg/dL (ref 100–199)
HDL: 45 mg/dL (ref >39)
LDL Chol Calc (NIH): 89 mg/dL (ref 0–99)
Triglycerides: 189 mg/dL — ABNORMAL HIGH (ref 0–149)
VLDL Cholesterol Cal: 32 mg/dL (ref 5–40)

## 2023-04-26 NOTE — Assessment & Plan Note (Signed)
No recurrence continue with allopurinol

## 2023-04-26 NOTE — Assessment & Plan Note (Signed)
Status post diverticulitis with robotic colectomy appears to be resolved patient is continuing with his diet to prevent further diverticulitis.  He had a colonoscopy showing no significant disease with a small polyp that was benign repeat in 10 years

## 2023-04-26 NOTE — Assessment & Plan Note (Signed)
Continue with lifestyle management

## 2023-04-26 NOTE — Assessment & Plan Note (Signed)
Plan for this patient is to increase CPAP to 14 cm water pressure.  Patient does not have insurance cannot do a CPAP titration study.  Patient be brought back short-term to reassess.

## 2023-04-26 NOTE — Progress Notes (Signed)
Let pt know cholesterol improved, stay on atorvastatin same dose

## 2023-04-27 ENCOUNTER — Telehealth: Payer: Self-pay

## 2023-04-27 NOTE — Telephone Encounter (Signed)
-----   Message from Shan Levans sent at 04/26/2023  5:48 AM EDT ----- Let pt know cholesterol improved, stay on atorvastatin same dose

## 2023-04-27 NOTE — Telephone Encounter (Signed)
Pt was called and is aware of results, DOB was confirmed.  ?

## 2023-06-20 ENCOUNTER — Other Ambulatory Visit: Payer: Self-pay

## 2023-07-21 ENCOUNTER — Other Ambulatory Visit: Payer: Self-pay | Admitting: Podiatry

## 2023-08-28 ENCOUNTER — Ambulatory Visit: Payer: Self-pay | Admitting: Family Medicine

## 2023-09-25 ENCOUNTER — Other Ambulatory Visit: Payer: Self-pay

## 2023-10-03 ENCOUNTER — Other Ambulatory Visit: Payer: Self-pay

## 2023-10-24 ENCOUNTER — Other Ambulatory Visit: Payer: Self-pay | Admitting: Podiatry

## 2023-10-26 ENCOUNTER — Other Ambulatory Visit: Payer: Self-pay | Admitting: Podiatry

## 2023-10-26 ENCOUNTER — Telehealth: Payer: Self-pay

## 2023-10-26 MED ORDER — ALLOPURINOL 100 MG PO TABS: 200.0000 mg | ORAL_TABLET | Freq: Every day | ORAL | 0 refills | Status: DC

## 2023-10-26 NOTE — Telephone Encounter (Signed)
Patient called, said he has requested a refill of Allopurinol  through his pharmacy - that went to Dr. Burna Mortimer on 1/14 - he was last seen in the office on 06/20/2022

## 2023-12-25 ENCOUNTER — Other Ambulatory Visit: Payer: Self-pay | Admitting: Critical Care Medicine

## 2023-12-28 ENCOUNTER — Other Ambulatory Visit: Payer: Self-pay

## 2024-01-27 ENCOUNTER — Other Ambulatory Visit: Payer: Self-pay | Admitting: Podiatry

## 2024-04-27 ENCOUNTER — Other Ambulatory Visit: Payer: Self-pay | Admitting: Podiatry

## 2024-04-29 ENCOUNTER — Other Ambulatory Visit: Payer: Self-pay | Admitting: Podiatry

## 2024-04-29 ENCOUNTER — Telehealth: Payer: Self-pay | Admitting: Podiatry

## 2024-04-29 DIAGNOSIS — M109 Gout, unspecified: Secondary | ICD-10-CM

## 2024-04-29 NOTE — Telephone Encounter (Signed)
 I received a request for his allopurinol  for gout. It has been some time since he was seen and no recent blood work. I went ahead and sent it over but I also put in an order for him to get blood work done at [facility]. Can you let him know to get this done? Thanks!

## 2024-04-30 NOTE — Telephone Encounter (Signed)
 Spoke to patient and discussed lab work needing to be done and medication sent.

## 2024-07-27 ENCOUNTER — Other Ambulatory Visit: Payer: Self-pay | Admitting: Podiatry

## 2024-07-31 ENCOUNTER — Ambulatory Visit: Payer: Self-pay | Admitting: Podiatry

## 2024-07-31 LAB — BASIC METABOLIC PANEL WITH GFR
BUN/Creatinine Ratio: 15 (ref 9–20)
BUN: 16 mg/dL (ref 6–24)
CO2: 22 mmol/L (ref 20–29)
Calcium: 9.4 mg/dL (ref 8.7–10.2)
Chloride: 103 mmol/L (ref 96–106)
Creatinine, Ser: 1.05 mg/dL (ref 0.76–1.27)
Glucose: 83 mg/dL (ref 70–99)
Potassium: 4.2 mmol/L (ref 3.5–5.2)
Sodium: 141 mmol/L (ref 134–144)
eGFR: 88 mL/min/1.73 (ref >59)

## 2024-07-31 LAB — URIC ACID: Uric Acid: 6.3 mg/dL (ref 3.8–8.4)

## 2024-08-01 ENCOUNTER — Telehealth: Payer: Self-pay | Admitting: Lab

## 2024-08-01 ENCOUNTER — Other Ambulatory Visit: Payer: Self-pay | Admitting: Podiatry

## 2024-08-01 MED ORDER — ALLOPURINOL 100 MG PO TABS: 200.0000 mg | ORAL_TABLET | Freq: Every day | ORAL | 0 refills | Status: DC

## 2024-08-01 NOTE — Telephone Encounter (Signed)
 Patient calling for refill on gout medication .

## 2024-10-28 ENCOUNTER — Other Ambulatory Visit: Payer: Self-pay | Admitting: Podiatry
